# Patient Record
Sex: Male | Born: 1960 | Race: White | Hispanic: No | Marital: Married | Smoking: Current some day smoker
Health system: Southern US, Community
[De-identification: ages and names within clinical notes are randomized; demographics above are authoritative.]

## PROBLEM LIST (undated history)

## (undated) DIAGNOSIS — E079 Disorder of thyroid, unspecified: Secondary | ICD-10-CM

## (undated) DIAGNOSIS — E785 Hyperlipidemia, unspecified: Secondary | ICD-10-CM

## (undated) DIAGNOSIS — L299 Pruritus, unspecified: Secondary | ICD-10-CM

## (undated) DIAGNOSIS — M1990 Unspecified osteoarthritis, unspecified site: Secondary | ICD-10-CM

## (undated) DIAGNOSIS — T5691XA Toxic effect of unspecified metal, accidental (unintentional), initial encounter: Secondary | ICD-10-CM

---

## 2002-09-27 ENCOUNTER — Encounter: Admission: RE | Admit: 2002-09-27 | Discharge: 2002-09-27 | Payer: Self-pay | Admitting: Family Medicine

## 2002-09-27 ENCOUNTER — Encounter: Payer: Self-pay | Admitting: Family Medicine

## 2010-11-26 HISTORY — PX: JOINT REPLACEMENT: SHX530

## 2010-12-02 ENCOUNTER — Inpatient Hospital Stay (HOSPITAL_COMMUNITY)
Admission: RE | Admit: 2010-12-02 | Discharge: 2010-12-05 | Payer: Self-pay | Source: Home / Self Care | Attending: Orthopedic Surgery | Admitting: Orthopedic Surgery

## 2011-03-09 LAB — COMPREHENSIVE METABOLIC PANEL
Alkaline Phosphatase: 47 U/L (ref 39–117)
BUN: 11 mg/dL (ref 6–23)
Chloride: 105 mEq/L (ref 96–112)
Creatinine, Ser: 1.08 mg/dL (ref 0.4–1.5)
Glucose, Bld: 88 mg/dL (ref 70–99)
Potassium: 4.4 mEq/L (ref 3.5–5.1)
Total Bilirubin: 0.7 mg/dL (ref 0.3–1.2)

## 2011-03-09 LAB — CBC
HCT: 36.7 % — ABNORMAL LOW (ref 39.0–52.0)
HCT: 37.1 % — ABNORMAL LOW (ref 39.0–52.0)
HCT: 47 % (ref 39.0–52.0)
Hemoglobin: 11.7 g/dL — ABNORMAL LOW (ref 13.0–17.0)
Hemoglobin: 12.2 g/dL — ABNORMAL LOW (ref 13.0–17.0)
MCH: 30.5 pg (ref 26.0–34.0)
MCH: 30.9 pg (ref 26.0–34.0)
MCH: 31.4 pg (ref 26.0–34.0)
MCHC: 31.9 g/dL (ref 30.0–36.0)
MCHC: 32.4 g/dL (ref 30.0–36.0)
MCV: 92.8 fL (ref 78.0–100.0)
MCV: 95.5 fL (ref 78.0–100.0)
MCV: 95.6 fL (ref 78.0–100.0)
MCV: 92.3 fL (ref 78.0–100.0)
Platelets: 142 10*3/uL — ABNORMAL LOW (ref 150–400)
Platelets: 172 10*3/uL (ref 150–400)
RBC: 5.09 MIL/uL (ref 4.22–5.81)
RDW: 12.9 % (ref 11.5–15.5)
RDW: 12.9 % (ref 11.5–15.5)
RDW: 12.8 % (ref 11.5–15.5)
WBC: 8.3 10*3/uL (ref 4.0–10.5)
WBC: 7.6 10*3/uL (ref 4.0–10.5)

## 2011-03-09 LAB — URINALYSIS, ROUTINE W REFLEX MICROSCOPIC
Bilirubin Urine: NEGATIVE
Ketones, ur: NEGATIVE mg/dL
Nitrite: NEGATIVE
Protein, ur: NEGATIVE mg/dL
pH: 6.5 (ref 5.0–8.0)

## 2011-03-09 LAB — BASIC METABOLIC PANEL
BUN: 7 mg/dL (ref 6–23)
BUN: 9 mg/dL (ref 6–23)
CO2: 30 mEq/L (ref 19–32)
Chloride: 101 mEq/L (ref 96–112)
Chloride: 102 mEq/L (ref 96–112)
GFR calc non Af Amer: >60 mL/min (ref >60)
Glucose, Bld: 125 mg/dL — ABNORMAL HIGH (ref 70–99)
Potassium: 3.7 mEq/L (ref 3.5–5.1)
Potassium: 4.4 mEq/L (ref 3.5–5.1)
Sodium: 137 mEq/L (ref 135–145)
Sodium: 138 mEq/L (ref 135–145)

## 2011-03-09 LAB — APTT: aPTT: 44 seconds — ABNORMAL HIGH (ref 24–37)

## 2011-03-09 LAB — TYPE AND SCREEN
ABO/RH(D): A POS
Antibody Screen: NEGATIVE

## 2011-03-09 LAB — PROTIME-INR
INR: 1.02 (ref 0.00–1.49)
Prothrombin Time: 13.6 seconds (ref 11.6–15.2)

## 2011-03-09 LAB — ABO/RH: ABO/RH(D): A POS

## 2011-07-28 DEATH — deceased

## 2014-06-25 NOTE — Progress Notes (Signed)
Need orders in EPIC.  Surgery on 07/12/14.  Preop on 07/05/14 at 1030am.  Thank You.

## 2014-06-27 ENCOUNTER — Other Ambulatory Visit: Payer: Self-pay | Admitting: Orthopedic Surgery

## 2014-07-04 ENCOUNTER — Other Ambulatory Visit (HOSPITAL_COMMUNITY): Payer: Self-pay | Admitting: *Deleted

## 2014-07-04 NOTE — Patient Instructions (Addendum)
Brad Campbell  07/04/2014                           YOUR PROCEDURE IS SCHEDULED ON: 07/12/14 AT 12:30 PM               ENTER THRU [place] MAIN HOSPITAL ENTRANCE AND                            FOLLOW  SIGNS TO SHORT STAY CENTER                 ARRIVE AT SHORT STAY AT: 10:00 AM               CALL THIS NUMBER IF ANY PROBLEMS THE DAY OF SURGERY :               [phone]                                REMEMBER:   Do not eat food  AFTER MIDNIGHT   May have clear liquids UNTIL 6 HOURS BEFORE SURGERY (6:30 AM)               Take these medicines the morning of surgery with               A SIPS OF WATER :      NONE   Do not wear jewelry, make-up   Do not wear lotions, powders, or perfumes.   Do not shave legs or underarms 12 hrs. before surgery (men may shave face)  Do not bring valuables to the hospital.  Contacts, dentures or bridgework may not be worn into surgery.  Leave suitcase in the car. After surgery it may be brought to your room.  For patients admitted to the hospital more than one night, checkout time is            11:00 AM                                                      ________________________________________________________________________                               CLEAR LIQUID DIET   Foods Allowed                                                                     Foods Excluded  Coffee and tea, regular and decaf                             liquids that you cannot  Plain Jell-O in any flavor  see through such as: Fruit ices (not with fruit pulp)                                     milk, soups, orange juice  Iced Popsicles                                    All solid food Carbonated beverages, regular and diet                                    Cranberry, grape and apple juices Sports drinks like Gatorade Lightly seasoned clear broth or consume(fat free) Sugar, honey  syrup   _____________________________________________________________________                [place] - PREPARING FOR SURGERY  Before surgery, you can play an important role.  Because skin is not sterile, your skin needs to be as free of germs as possible.  You can reduce the number of germs on your skin by washing with CHG (chlorahexidine gluconate) soap before surgery.  CHG is an antiseptic cleaner which kills germs and bonds with the skin to continue killing germs even after washing. Please DO NOT use if you have an allergy to CHG or antibacterial soaps.  If your skin becomes reddened/irritated stop using the CHG and inform your nurse when you arrive at Short Stay. Do not shave (including legs and underarms) for at least 48 hours prior to the first CHG shower.  You may shave your face. Please follow these instructions carefully:   1.  Shower with CHG Soap the night before surgery and the  morning of Surgery.   2.  If you choose to wash your hair, wash your hair first as usual with your  normal  Shampoo.   3.  After you shampoo, rinse your hair and body thoroughly to remove the  shampoo.                                         4.  Use CHG as you would any other liquid soap.  You can apply chg directly  to the skin and wash . Gently wash with scrungie or clean wascloth    5.  Apply the CHG Soap to your body ONLY FROM THE NECK DOWN.   Do not use on open                           Wound or open sores. Avoid contact with eyes, ears mouth and genitals (private parts).                        Genitals (private parts) with your normal soap.              6.  Wash thoroughly, paying special attention to the area where your surgery  will be performed.   7.  Thoroughly rinse your body with warm water from the neck down.   8.  DO NOT shower/wash with your normal soap after using and rinsing off  the CHG Soap .  9.  Pat yourself dry with a clean towel.             10.  Wear clean  pajamas.             11.  Place clean sheets on your bed the night of your first shower and do not  sleep with pets.  Day of Surgery : Do not apply any lotions/deodorants the morning of surgery.  Please wear clean clothes to the hospital/surgery center.  FAILURE TO FOLLOW THESE INSTRUCTIONS MAY RESULT IN THE CANCELLATION OF YOUR SURGERY    PATIENT SIGNATURE_________________________________  ______________________________________________________________________     Brad Campbell  An incentive spirometer is a tool that can help keep your lungs clear and active. This tool measures how well you are filling your lungs with each breath. Taking long deep breaths may help reverse or decrease the chance of developing breathing (pulmonary) problems (especially infection) following:  A long period of time when you are unable to move or be active. BEFORE THE PROCEDURE   If the spirometer includes an indicator to show your best effort, your nurse or respiratory therapist will set it to a desired goal.  If possible, sit up straight or lean slightly forward. Try not to slouch.  Hold the incentive spirometer in an upright position. INSTRUCTIONS FOR USE  1. Sit on the edge of your bed if possible, or sit up as far as you can in bed or on a chair. 2. Hold the incentive spirometer in an upright position. 3. Breathe out normally. 4. Place the mouthpiece in your mouth and seal your lips tightly around it. 5. Breathe in slowly and as deeply as possible, raising the piston or the ball toward the top of the column. 6. Hold your breath for 3-5 seconds or for as long as possible. Allow the piston or ball to fall to the bottom of the column. 7. Remove the mouthpiece from your mouth and breathe out normally. 8. Rest for a few seconds and repeat Steps 1 through 7 at least 10 times every 1-2 hours when you are awake. Take your time and take a few normal breaths between deep breaths. 9. The spirometer  may include an indicator to show your best effort. Use the indicator as a goal to work toward during each repetition. 10. After each set of 10 deep breaths, practice coughing to be sure your lungs are clear. If you have an incision (the cut made at the time of surgery), support your incision when coughing by placing a pillow or rolled up towels firmly against it. Once you are able to get out of bed, walk around indoors and cough well. You may stop using the incentive spirometer when instructed by your caregiver.  RISKS AND COMPLICATIONS  Take your time so you do not get dizzy or light-headed.  If you are in pain, you may need to take or ask for pain medication before doing incentive spirometry. It is harder to take a deep breath if you are having pain. AFTER USE  Rest and breathe slowly and easily.  It can be helpful to keep track of a log of your progress. Your caregiver can provide you with a simple table to help with this. If you are using the spirometer at home, follow these instructions: [place] IF:   You are having difficultly using the spirometer.  You have trouble using the spirometer as often as instructed.  Your pain medication is not giving enough relief while  using the spirometer.  You develop fever of 100.5 F (38.1 C) or higher. SEEK IMMEDIATE MEDICAL CARE IF:   You cough up bloody sputum that had not been present before.  You develop fever of 102 F (38.9 C) or greater.  You develop worsening pain at or near the incision site. MAKE SURE YOU:   Understand these instructions.  Will watch your condition.  Will get help right away if you are not doing well or get worse. Document Released: 04/25/2007 Document Revised: 03/06/2012 Document Reviewed: 06/26/2007 Exit[employer] Patient Information 2014 Exit[employer], [place].   ________________________________________________________________________  WHAT IS A BLOOD TRANSFUSION? Blood Transfusion Information  A  transfusion is the replacement of blood or some of its parts. Blood is made up of multiple cells which provide different functions.  Red blood cells carry oxygen and are used for blood loss replacement.  White blood cells fight against infection.  Platelets control bleeding.  Plasma helps clot blood.  Other blood products are available for specialized needs, such as hemophilia or other clotting disorders. BEFORE THE TRANSFUSION  Who gives blood for transfusions?   Healthy volunteers who are fully evaluated to make sure their blood is safe. This is blood bank blood. Transfusion therapy is the safest it has ever been in the practice of medicine. Before blood is taken from a donor, a complete history is taken to make sure that person has no history of diseases nor engages in risky social behavior (examples are intravenous drug use or sexual activity with multiple partners). The donor's travel history is screened to minimize risk of transmitting infections, such as malaria. The donated blood is tested for signs of infectious diseases, such as HIV and hepatitis. The blood is then tested to be sure it is compatible with you in order to minimize the chance of a transfusion reaction. If you or a relative donates blood, this is often done in anticipation of surgery and is not appropriate for emergency situations. It takes many days to process the donated blood. RISKS AND COMPLICATIONS Although transfusion therapy is very safe and saves many lives, the main dangers of transfusion include:   Getting an infectious disease.  Developing a transfusion reaction. This is an allergic reaction to something in the blood you were given. Every precaution is taken to prevent this. The decision to have a blood transfusion has been considered carefully by your caregiver before blood is given. Blood is not given unless the benefits outweigh the risks. AFTER THE TRANSFUSION  Right after receiving a blood transfusion,  you will usually feel much better and more energetic. This is especially true if your red blood cells have gotten low (anemic). The transfusion raises the level of the red blood cells which carry oxygen, and this usually causes an energy increase.  The nurse administering the transfusion will monitor you carefully for complications. HOME CARE INSTRUCTIONS  No special instructions are needed after a transfusion. You may find your energy is better. Speak with your caregiver about any limitations on activity for underlying diseases you may have. SEEK MEDICAL CARE IF:   Your condition is not improving after your transfusion.  You develop redness or irritation at the intravenous (IV) site. SEEK IMMEDIATE MEDICAL CARE IF:  Any of the following symptoms occur over the next 12 hours:  Shaking chills.  You have a temperature by mouth above 102 F (38.9 C), not controlled by medicine.  Chest, back, or muscle pain.  People around you feel you are not acting correctly or  are confused.  Shortness of breath or difficulty breathing.  Dizziness and fainting.  You get a rash or develop hives.  You have a decrease in urine output.  Your urine turns a dark color or changes to pink, red, or brown. Any of the following symptoms occur over the next 10 days:  You have a temperature by mouth above 102 F (38.9 C), not controlled by medicine.  Shortness of breath.  Weakness after normal activity.  The white part of the eye turns yellow (jaundice).  You have a decrease in the amount of urine or are urinating less often.  Your urine turns a dark color or changes to pink, red, or brown. Document Released: 12/10/2000 Document Revised: 03/06/2012 Document Reviewed: 07/29/2008 [facility] Patient Information 2014 [place], [place].  _______________________________________________________________________

## 2014-07-05 ENCOUNTER — Encounter (HOSPITAL_COMMUNITY): Payer: Self-pay

## 2014-07-05 ENCOUNTER — Encounter (INDEPENDENT_AMBULATORY_CARE_PROVIDER_SITE_OTHER): Payer: Self-pay

## 2014-07-05 ENCOUNTER — Ambulatory Visit (HOSPITAL_COMMUNITY)
Admission: RE | Admit: 2014-07-05 | Discharge: 2014-07-05 | Disposition: A | Discharge status: Home / Self Care | Payer: 59 | Source: Ambulatory Visit | Attending: Orthopedic Surgery | Admitting: Orthopedic Surgery

## 2014-07-05 ENCOUNTER — Encounter (HOSPITAL_COMMUNITY): Payer: Self-pay | Admitting: Pharmacy Technician

## 2014-07-05 ENCOUNTER — Encounter (HOSPITAL_COMMUNITY)
Admission: RE | Admit: 2014-07-05 | Discharge: 2014-07-05 | Disposition: A | Discharge status: Home / Self Care | Payer: 59 | Source: Ambulatory Visit | Attending: Orthopedic Surgery | Admitting: Orthopedic Surgery

## 2014-07-05 DIAGNOSIS — Z01818 Encounter for other preprocedural examination: Secondary | ICD-10-CM | POA: Insufficient documentation

## 2014-07-05 DIAGNOSIS — M1610 Unilateral primary osteoarthritis, unspecified hip: Secondary | ICD-10-CM | POA: Insufficient documentation

## 2014-07-05 DIAGNOSIS — Z01812 Encounter for preprocedural laboratory examination: Secondary | ICD-10-CM | POA: Insufficient documentation

## 2014-07-05 DIAGNOSIS — M169 Osteoarthritis of hip, unspecified: Secondary | ICD-10-CM | POA: Insufficient documentation

## 2014-07-05 HISTORY — DX: Pruritus, unspecified: L29.9

## 2014-07-05 HISTORY — DX: Hyperlipidemia, unspecified: E78.5

## 2014-07-05 HISTORY — DX: Toxic effect of unspecified metal, accidental (unintentional), initial encounter: T56.91XA

## 2014-07-05 HISTORY — DX: Unspecified osteoarthritis, unspecified site: M19.90

## 2014-07-05 HISTORY — DX: Disorder of thyroid, unspecified: E07.9

## 2014-07-05 LAB — COMPREHENSIVE METABOLIC PANEL
ALBUMIN: 3.7 g/dL (ref 3.5–5.2)
ALT: 19 U/L (ref 0–53)
AST: 20 U/L (ref 0–37)
Alkaline Phosphatase: 98 U/L (ref 39–117)
Anion gap: 11 (ref 5–15)
BUN: 14 mg/dL (ref 6–23)
CALCIUM: 9.5 mg/dL (ref 8.4–10.5)
CO2: 28 meq/L (ref 19–32)
Chloride: 101 mEq/L (ref 96–112)
Creatinine, Ser: 0.91 mg/dL (ref 0.50–1.35)
GFR calc Af Amer: >90 mL/min (ref >90)
GFR calc non Af Amer: >90 mL/min (ref >90)
Glucose, Bld: 80 mg/dL (ref 70–99)
Potassium: 4.4 mEq/L (ref 3.7–5.3)
SODIUM: 140 meq/L (ref 137–147)
TOTAL PROTEIN: 8 g/dL (ref 6.0–8.3)
Total Bilirubin: 0.2 mg/dL — ABNORMAL LOW (ref 0.3–1.2)

## 2014-07-05 LAB — CBC
HCT: 42.6 % (ref 39.0–52.0)
Hemoglobin: 13.5 g/dL (ref 13.0–17.0)
MCH: 27.4 pg (ref 26.0–34.0)
MCHC: 31.7 g/dL (ref 30.0–36.0)
MCV: 86.4 fL (ref 78.0–100.0)
Platelets: 248 10*3/uL (ref 150–400)
RBC: 4.93 MIL/uL (ref 4.22–5.81)
RDW: 13.9 % (ref 11.5–15.5)
WBC: 10.9 10*3/uL — ABNORMAL HIGH (ref 4.0–10.5)

## 2014-07-05 LAB — SURGICAL PCR SCREEN
MRSA, PCR: NEGATIVE
Staphylococcus aureus: POSITIVE — AB

## 2014-07-05 LAB — URINALYSIS, ROUTINE W REFLEX MICROSCOPIC
Bilirubin Urine: NEGATIVE
Glucose, UA: NEGATIVE mg/dL
Hgb urine dipstick: NEGATIVE
Ketones, ur: NEGATIVE mg/dL
LEUKOCYTES UA: NEGATIVE
NITRITE: NEGATIVE
PH: 6 (ref 5.0–8.0)
Protein, ur: NEGATIVE mg/dL
SPECIFIC GRAVITY, URINE: 1.016 (ref 1.005–1.030)
UROBILINOGEN UA: 1 mg/dL (ref 0.0–1.0)

## 2014-07-05 LAB — APTT: aPTT: 41 seconds — ABNORMAL HIGH (ref 24–37)

## 2014-07-05 LAB — PROTIME-INR
INR: 1.01 (ref 0.00–1.49)
Prothrombin Time: 13.3 seconds (ref 11.6–15.2)

## 2014-07-05 NOTE — Progress Notes (Signed)
07/05/14 1036  OBSTRUCTIVE SLEEP APNEA  Have you ever been diagnosed with sleep apnea through a sleep study? No  Do you snore loudly (loud enough to be heard through closed doors)?  1  Do you often feel tired, fatigued, or sleepy during the daytime? 1  Has anyone observed you stop breathing during your sleep? 0  Do you have, or are you being treated for high blood pressure? 0  BMI more than 35 kg/m2? 0  Age over 51 years old? 1  Neck circumference greater than 40 cm/16 inches? 1  Gender: 1  Obstructive Sleep Apnea Score 5  Score 4 or greater  Results sent to PCP

## 2014-07-05 NOTE — Progress Notes (Signed)
Rx Mupuricin called to [facility] - pt notified - PCR routed to Dr. Lequita HaltAluisio

## 2014-07-12 ENCOUNTER — Inpatient Hospital Stay (HOSPITAL_COMMUNITY): Payer: 59 | Admitting: Anesthesiology

## 2014-07-12 ENCOUNTER — Inpatient Hospital Stay (HOSPITAL_COMMUNITY)
Admission: RE | Admit: 2014-07-12 | Discharge: 2014-07-13 | DRG: 468 | Disposition: A | Discharge status: Home Health | Payer: 59 | Source: Ambulatory Visit | Attending: Orthopedic Surgery | Admitting: Orthopedic Surgery

## 2014-07-12 ENCOUNTER — Encounter (HOSPITAL_COMMUNITY)
Admission: RE | Disposition: A | Discharge status: Home Health | Payer: Self-pay | Source: Ambulatory Visit | Attending: Orthopedic Surgery

## 2014-07-12 ENCOUNTER — Encounter (HOSPITAL_COMMUNITY): Payer: 59 | Admitting: Anesthesiology

## 2014-07-12 ENCOUNTER — Encounter (HOSPITAL_COMMUNITY): Payer: Self-pay | Admitting: *Deleted

## 2014-07-12 ENCOUNTER — Inpatient Hospital Stay (HOSPITAL_COMMUNITY): Payer: 59

## 2014-07-12 ENCOUNTER — Other Ambulatory Visit: Payer: Self-pay | Admitting: Orthopedic Surgery

## 2014-07-12 DIAGNOSIS — E785 Hyperlipidemia, unspecified: Secondary | ICD-10-CM | POA: Diagnosis present

## 2014-07-12 DIAGNOSIS — T84018A Broken internal joint prosthesis, other site, initial encounter: Secondary | ICD-10-CM

## 2014-07-12 DIAGNOSIS — Z8249 Family history of ischemic heart disease and other diseases of the circulatory system: Secondary | ICD-10-CM

## 2014-07-12 DIAGNOSIS — R7989 Other specified abnormal findings of blood chemistry: Secondary | ICD-10-CM | POA: Diagnosis present

## 2014-07-12 DIAGNOSIS — Z96649 Presence of unspecified artificial hip joint: Secondary | ICD-10-CM

## 2014-07-12 DIAGNOSIS — M1610 Unilateral primary osteoarthritis, unspecified hip: Secondary | ICD-10-CM | POA: Diagnosis present

## 2014-07-12 DIAGNOSIS — Z79899 Other long term (current) drug therapy: Secondary | ICD-10-CM

## 2014-07-12 DIAGNOSIS — Z87891 Personal history of nicotine dependence: Secondary | ICD-10-CM

## 2014-07-12 DIAGNOSIS — Y831 Surgical operation with implant of artificial internal device as the cause of abnormal reaction of the patient, or of later complication, without mention of misadventure at the time of the procedure: Secondary | ICD-10-CM | POA: Diagnosis present

## 2014-07-12 DIAGNOSIS — M169 Osteoarthritis of hip, unspecified: Secondary | ICD-10-CM

## 2014-07-12 DIAGNOSIS — T84099A Other mechanical complication of unspecified internal joint prosthesis, initial encounter: Principal | ICD-10-CM | POA: Diagnosis present

## 2014-07-12 HISTORY — PX: TOTAL HIP REVISION: SHX763

## 2014-07-12 LAB — TYPE AND SCREEN
ABO/RH(D): A POS
ANTIBODY SCREEN: POSITIVE
DAT, IgG: NEGATIVE

## 2014-07-12 LAB — GRAM STAIN: Gram Stain: NONE SEEN

## 2014-07-12 SURGERY — TOTAL HIP REVISION
Anesthesia: Choice | Site: Hip | Laterality: Left

## 2014-07-12 MED ORDER — HYDROMORPHONE HCL PF 2 MG/ML IJ SOLN
INTRAMUSCULAR | Status: AC
  Filled 2014-07-12: qty 1

## 2014-07-12 MED ORDER — GLYCOPYRROLATE 0.2 MG/ML IJ SOLN
INTRAMUSCULAR | Status: DC | PRN
  Administered 2014-07-12: 0.6 mg via INTRAVENOUS

## 2014-07-12 MED ORDER — FLEET ENEMA 7-19 GM/118ML RE ENEM: 1.0000 | ENEMA | Freq: Once | RECTAL | Status: AC | PRN

## 2014-07-12 MED ORDER — STERILE WATER FOR IRRIGATION IR SOLN
Status: DC | PRN
  Administered 2014-07-12: 1500 mL

## 2014-07-12 MED ORDER — KETOROLAC TROMETHAMINE 15 MG/ML IJ SOLN
INTRAMUSCULAR | Status: AC
  Filled 2014-07-12: qty 1

## 2014-07-12 MED ORDER — OXYCODONE HCL 5 MG PO TABS
5.0000 mg | ORAL_TABLET | ORAL | Status: DC | PRN
  Administered 2014-07-12 – 2014-07-13 (×4): 10 mg via ORAL
  Administered 2014-07-13: 5 mg via ORAL
  Filled 2014-07-12 (×4): qty 2
  Filled 2014-07-12: qty 1

## 2014-07-12 MED ORDER — ACETAMINOPHEN 325 MG PO TABS: 650.0000 mg | ORAL_TABLET | Freq: Four times a day (QID) | ORAL | Status: DC | PRN

## 2014-07-12 MED ORDER — DOCUSATE SODIUM 100 MG PO CAPS
100.0000 mg | ORAL_CAPSULE | Freq: Two times a day (BID) | ORAL | Status: DC
  Administered 2014-07-12 – 2014-07-13 (×2): 100 mg via ORAL

## 2014-07-12 MED ORDER — CHLORHEXIDINE GLUCONATE 4 % EX LIQD: 60.0000 mL | Freq: Once | CUTANEOUS | Status: DC

## 2014-07-12 MED ORDER — KETOROLAC TROMETHAMINE 15 MG/ML IJ SOLN
7.5000 mg | Freq: Four times a day (QID) | INTRAMUSCULAR | Status: AC | PRN
  Administered 2014-07-12: 7.5 mg via INTRAVENOUS

## 2014-07-12 MED ORDER — PROMETHAZINE HCL 25 MG/ML IJ SOLN: 6.2500 mg | INTRAMUSCULAR | Status: DC | PRN

## 2014-07-12 MED ORDER — MIDAZOLAM HCL 2 MG/2ML IJ SOLN
INTRAMUSCULAR | Status: AC
  Filled 2014-07-12: qty 2

## 2014-07-12 MED ORDER — BISACODYL 10 MG RE SUPP: 10.0000 mg | Freq: Every day | RECTAL | Status: DC | PRN

## 2014-07-12 MED ORDER — POLYETHYLENE GLYCOL 3350 17 G PO PACK: 17.0000 g | PACK | Freq: Every day | ORAL | Status: DC | PRN

## 2014-07-12 MED ORDER — MENTHOL 3 MG MT LOZG
1.0000 | LOZENGE | OROMUCOSAL | Status: DC | PRN
  Filled 2014-07-12: qty 9

## 2014-07-12 MED ORDER — DEXAMETHASONE 4 MG PO TABS
10.0000 mg | ORAL_TABLET | Freq: Every day | ORAL | Status: AC
  Administered 2014-07-13: 10 mg via ORAL
  Filled 2014-07-12: qty 1

## 2014-07-12 MED ORDER — BUPIVACAINE HCL (PF) 0.25 % IJ SOLN
INTRAMUSCULAR | Status: AC
  Filled 2014-07-12: qty 30

## 2014-07-12 MED ORDER — FENTANYL CITRATE 0.05 MG/ML IJ SOLN
INTRAMUSCULAR | Status: AC
  Filled 2014-07-12: qty 5

## 2014-07-12 MED ORDER — OXYCODONE HCL 5 MG/5ML PO SOLN
5.0000 mg | Freq: Once | ORAL | Status: DC | PRN
  Filled 2014-07-12: qty 5

## 2014-07-12 MED ORDER — LIDOCAINE HCL (CARDIAC) 20 MG/ML IV SOLN
INTRAVENOUS | Status: DC | PRN
  Administered 2014-07-12: 100 mg via INTRAVENOUS

## 2014-07-12 MED ORDER — FENTANYL CITRATE 0.05 MG/ML IJ SOLN
INTRAMUSCULAR | Status: DC | PRN
  Administered 2014-07-12: 100 ug via INTRAVENOUS
  Administered 2014-07-12: 50 ug via INTRAVENOUS
  Administered 2014-07-12: 100 ug via INTRAVENOUS

## 2014-07-12 MED ORDER — BUPIVACAINE LIPOSOME 1.3 % IJ SUSP
20.0000 mL | Freq: Once | INTRAMUSCULAR | Status: DC
  Filled 2014-07-12: qty 20

## 2014-07-12 MED ORDER — HYDROMORPHONE HCL PF 1 MG/ML IJ SOLN
0.2500 mg | INTRAMUSCULAR | Status: DC | PRN
  Administered 2014-07-12 (×3): 0.5 mg via INTRAVENOUS

## 2014-07-12 MED ORDER — LEVOTHYROXINE SODIUM 75 MCG PO TABS
75.0000 ug | ORAL_TABLET | Freq: Every day | ORAL | Status: DC
  Administered 2014-07-12: 75 ug via ORAL
  Filled 2014-07-12 (×2): qty 1

## 2014-07-12 MED ORDER — ACETAMINOPHEN 500 MG PO TABS
1000.0000 mg | ORAL_TABLET | Freq: Four times a day (QID) | ORAL | Status: AC
  Administered 2014-07-12 – 2014-07-13 (×4): 1000 mg via ORAL
  Filled 2014-07-12 (×3): qty 2

## 2014-07-12 MED ORDER — CEFAZOLIN SODIUM-DEXTROSE 2-3 GM-% IV SOLR
INTRAVENOUS | Status: AC
  Filled 2014-07-12: qty 50

## 2014-07-12 MED ORDER — ONDANSETRON HCL 4 MG/2ML IJ SOLN
INTRAMUSCULAR | Status: DC | PRN
  Administered 2014-07-12: 4 mg via INTRAVENOUS

## 2014-07-12 MED ORDER — DEXAMETHASONE SODIUM PHOSPHATE 10 MG/ML IJ SOLN
INTRAMUSCULAR | Status: AC
  Filled 2014-07-12: qty 1

## 2014-07-12 MED ORDER — ONDANSETRON HCL 4 MG/2ML IJ SOLN
INTRAMUSCULAR | Status: AC
  Filled 2014-07-12: qty 2

## 2014-07-12 MED ORDER — ACETAMINOPHEN 650 MG RE SUPP: 650.0000 mg | Freq: Four times a day (QID) | RECTAL | Status: DC | PRN

## 2014-07-12 MED ORDER — METHOCARBAMOL 500 MG PO TABS
500.0000 mg | ORAL_TABLET | Freq: Four times a day (QID) | ORAL | Status: DC | PRN
  Administered 2014-07-12 – 2014-07-13 (×2): 500 mg via ORAL
  Filled 2014-07-12 (×2): qty 1

## 2014-07-12 MED ORDER — SODIUM CHLORIDE 0.9 % IJ SOLN
INTRAMUSCULAR | Status: DC | PRN
  Administered 2014-07-12: 30 mL via INTRAVENOUS

## 2014-07-12 MED ORDER — TRANEXAMIC ACID 100 MG/ML IV SOLN
1000.0000 mg | INTRAVENOUS | Status: AC
  Administered 2014-07-12: 1000 mg via INTRAVENOUS
  Filled 2014-07-12: qty 10

## 2014-07-12 MED ORDER — NEOSTIGMINE METHYLSULFATE 10 MG/10ML IV SOLN
INTRAVENOUS | Status: DC | PRN
  Administered 2014-07-12: 4 mg via INTRAVENOUS

## 2014-07-12 MED ORDER — DIPHENHYDRAMINE HCL 12.5 MG/5ML PO ELIX: 12.5000 mg | ORAL_SOLUTION | ORAL | Status: DC | PRN

## 2014-07-12 MED ORDER — BUPIVACAINE HCL 0.25 % IJ SOLN
INTRAMUSCULAR | Status: DC | PRN
  Administered 2014-07-12: 20 mL

## 2014-07-12 MED ORDER — HYDROMORPHONE HCL PF 1 MG/ML IJ SOLN
INTRAMUSCULAR | Status: AC
  Filled 2014-07-12: qty 1

## 2014-07-12 MED ORDER — OXYCODONE HCL 5 MG PO TABS: 5.0000 mg | ORAL_TABLET | Freq: Once | ORAL | Status: DC | PRN

## 2014-07-12 MED ORDER — HYDROMORPHONE HCL PF 1 MG/ML IJ SOLN
INTRAMUSCULAR | Status: DC | PRN
  Administered 2014-07-12 (×2): 1 mg via INTRAVENOUS

## 2014-07-12 MED ORDER — ROCURONIUM BROMIDE 100 MG/10ML IV SOLN
INTRAVENOUS | Status: AC
  Filled 2014-07-12: qty 1

## 2014-07-12 MED ORDER — SIMVASTATIN 20 MG PO TABS
20.0000 mg | ORAL_TABLET | Freq: Every day | ORAL | Status: DC
  Administered 2014-07-12: 20 mg via ORAL
  Filled 2014-07-12 (×2): qty 1

## 2014-07-12 MED ORDER — LACTATED RINGERS IV SOLN
INTRAVENOUS | Status: DC
  Administered 2014-07-12: 14:00:00 via INTRAVENOUS
  Administered 2014-07-12: 1000 mL via INTRAVENOUS

## 2014-07-12 MED ORDER — METHOCARBAMOL 1000 MG/10ML IJ SOLN
500.0000 mg | Freq: Four times a day (QID) | INTRAVENOUS | Status: DC | PRN
  Administered 2014-07-12: 500 mg via INTRAVENOUS
  Filled 2014-07-12: qty 5

## 2014-07-12 MED ORDER — MORPHINE SULFATE 2 MG/ML IJ SOLN: 1.0000 mg | INTRAMUSCULAR | Status: DC | PRN

## 2014-07-12 MED ORDER — MIDAZOLAM HCL 5 MG/5ML IJ SOLN
INTRAMUSCULAR | Status: DC | PRN
  Administered 2014-07-12: 2 mg via INTRAVENOUS

## 2014-07-12 MED ORDER — ROCURONIUM BROMIDE 100 MG/10ML IV SOLN
INTRAVENOUS | Status: DC | PRN
  Administered 2014-07-12: 60 mg via INTRAVENOUS
  Administered 2014-07-12: 10 mg via INTRAVENOUS

## 2014-07-12 MED ORDER — 0.9 % SODIUM CHLORIDE (POUR BTL) OPTIME
TOPICAL | Status: DC | PRN
  Administered 2014-07-12: 1000 mL

## 2014-07-12 MED ORDER — BUPIVACAINE LIPOSOME 1.3 % IJ SUSP
20.0000 mL | Freq: Once | INTRAMUSCULAR | Status: AC
  Administered 2014-07-12: 20 mL
  Filled 2014-07-12: qty 20

## 2014-07-12 MED ORDER — METOCLOPRAMIDE HCL 10 MG PO TABS: 5.0000 mg | ORAL_TABLET | Freq: Three times a day (TID) | ORAL | Status: DC | PRN

## 2014-07-12 MED ORDER — MEPERIDINE HCL 50 MG/ML IJ SOLN
6.2500 mg | INTRAMUSCULAR | Status: DC | PRN
Start: 2014-07-12 — End: 2014-07-12

## 2014-07-12 MED ORDER — DEXTROSE-NACL 5-0.9 % IV SOLN
INTRAVENOUS | Status: DC
  Administered 2014-07-12: 17:00:00 via INTRAVENOUS

## 2014-07-12 MED ORDER — ACETAMINOPHEN 10 MG/ML IV SOLN
1000.0000 mg | Freq: Once | INTRAVENOUS | Status: AC
  Administered 2014-07-12: 1000 mg via INTRAVENOUS
  Filled 2014-07-12: qty 100

## 2014-07-12 MED ORDER — CEFAZOLIN SODIUM-DEXTROSE 2-3 GM-% IV SOLR
2.0000 g | INTRAVENOUS | Status: AC
  Administered 2014-07-12: 2 g via INTRAVENOUS

## 2014-07-12 MED ORDER — ONDANSETRON HCL 4 MG/2ML IJ SOLN: 4.0000 mg | Freq: Four times a day (QID) | INTRAMUSCULAR | Status: DC | PRN

## 2014-07-12 MED ORDER — DEXAMETHASONE SODIUM PHOSPHATE 10 MG/ML IJ SOLN
10.0000 mg | Freq: Once | INTRAMUSCULAR | Status: AC
  Administered 2014-07-12: 10 mg via INTRAVENOUS

## 2014-07-12 MED ORDER — DEXAMETHASONE SODIUM PHOSPHATE 10 MG/ML IJ SOLN
10.0000 mg | Freq: Every day | INTRAMUSCULAR | Status: AC
  Filled 2014-07-12: qty 1

## 2014-07-12 MED ORDER — LIDOCAINE HCL (CARDIAC) 20 MG/ML IV SOLN
INTRAVENOUS | Status: AC
  Filled 2014-07-12: qty 5

## 2014-07-12 MED ORDER — METOCLOPRAMIDE HCL 5 MG/ML IJ SOLN: 5.0000 mg | Freq: Three times a day (TID) | INTRAMUSCULAR | Status: DC | PRN

## 2014-07-12 MED ORDER — TRAMADOL HCL 50 MG PO TABS: 50.0000 mg | ORAL_TABLET | Freq: Four times a day (QID) | ORAL | Status: DC | PRN

## 2014-07-12 MED ORDER — PROPOFOL 10 MG/ML IV BOLUS
INTRAVENOUS | Status: AC
  Filled 2014-07-12: qty 20

## 2014-07-12 MED ORDER — PROPOFOL 10 MG/ML IV BOLUS
INTRAVENOUS | Status: DC | PRN
  Administered 2014-07-12: 200 mg via INTRAVENOUS

## 2014-07-12 MED ORDER — CEFAZOLIN SODIUM-DEXTROSE 2-3 GM-% IV SOLR
2.0000 g | Freq: Four times a day (QID) | INTRAVENOUS | Status: AC
  Administered 2014-07-12 – 2014-07-13 (×2): 2 g via INTRAVENOUS
  Filled 2014-07-12 (×2): qty 50

## 2014-07-12 MED ORDER — RIVAROXABAN 10 MG PO TABS
10.0000 mg | ORAL_TABLET | Freq: Every day | ORAL | Status: DC
  Administered 2014-07-13: 10 mg via ORAL
  Filled 2014-07-12 (×2): qty 1

## 2014-07-12 MED ORDER — SODIUM CHLORIDE 0.9 % IV SOLN: INTRAVENOUS | Status: DC

## 2014-07-12 MED ORDER — PHENOL 1.4 % MT LIQD: 1.0000 | OROMUCOSAL | Status: DC | PRN

## 2014-07-12 MED ORDER — ONDANSETRON HCL 4 MG PO TABS: 4.0000 mg | ORAL_TABLET | Freq: Four times a day (QID) | ORAL | Status: DC | PRN

## 2014-07-12 SURGICAL SUPPLY — 59 items
BAG SPEC THK2 15X12 ZIP CLS (MISCELLANEOUS)
BAG ZIPLOCK 12X15 (MISCELLANEOUS) ×3 IMPLANT
BIT DRILL 2.8X128 (BIT) ×2 IMPLANT
BLADE EXTENDED COATED 6.5IN (ELECTRODE) ×2 IMPLANT
BLADE SAW SAG 73X25 THK (BLADE) ×1
BLADE SAW SGTL 73X25 THK (BLADE) ×1 IMPLANT
DRAPE INCISE IOBAN 66X45 STRL (DRAPES) ×2 IMPLANT
DRAPE ORTHO SPLIT 77X108 STRL (DRAPES) ×4
DRAPE POUCH INSTRU U-SHP 10X18 (DRAPES) ×2 IMPLANT
DRAPE SURG ORHT 6 SPLT 77X108 (DRAPES) ×2 IMPLANT
DRAPE U-SHAPE 47X51 STRL (DRAPES) ×2 IMPLANT
DRSG EMULSION OIL 3X16 NADH (GAUZE/BANDAGES/DRESSINGS) ×2 IMPLANT
DRSG MEPILEX BORDER 4X4 (GAUZE/BANDAGES/DRESSINGS) ×3 IMPLANT
DRSG MEPILEX BORDER 4X8 (GAUZE/BANDAGES/DRESSINGS) ×2 IMPLANT
DURAPREP 26ML APPLICATOR (WOUND CARE) ×2 IMPLANT
ELECT REM PT RETURN 9FT ADLT (ELECTROSURGICAL) ×2
ELECTRODE REM PT RTRN 9FT ADLT (ELECTROSURGICAL) ×1 IMPLANT
EVACUATOR 1/8 PVC DRAIN (DRAIN) ×1 IMPLANT
FACESHIELD WRAPAROUND (MASK) ×8 IMPLANT
FACESHIELD WRAPAROUND OR TEAM (MASK) ×4 IMPLANT
GAUZE SPONGE 4X4 12PLY STRL (GAUZE/BANDAGES/DRESSINGS) ×2 IMPLANT
GLOVE BIO SURGEON STRL SZ7.5 (GLOVE) ×2 IMPLANT
GLOVE BIO SURGEON STRL SZ8 (GLOVE) ×2 IMPLANT
GLOVE BIOGEL PI IND STRL 8 (GLOVE) ×3 IMPLANT
GLOVE BIOGEL PI INDICATOR 8 (GLOVE) ×3
GLOVE SURG SS PI 6.5 STRL IVOR (GLOVE) ×6 IMPLANT
GOWN STRL REUS W/TWL LRG LVL3 (GOWN DISPOSABLE) ×5 IMPLANT
GOWN STRL REUS W/TWL XL LVL3 (GOWN DISPOSABLE) ×2 IMPLANT
HEAD CERAMIC BIOLOX DELTA 36 6 (Hips) ×1 IMPLANT
IMMOBILIZER KNEE 20 (SOFTGOODS)
IMMOBILIZER KNEE 20 THIGH 36 (SOFTGOODS) IMPLANT
KIT BASIN OR (CUSTOM PROCEDURE TRAY) ×2 IMPLANT
LINER MARATHON NEUT +4X58X36 (Hips) ×1 IMPLANT
MANIFOLD NEPTUNE II (INSTRUMENTS) ×2 IMPLANT
NDL SAFETY ECLIPSE 18X1.5 (NEEDLE) IMPLANT
NEEDLE HYPO 18GX1.5 SHARP (NEEDLE) ×2
NS IRRIG 1000ML POUR BTL (IV SOLUTION) ×2 IMPLANT
PACK TOTAL JOINT (CUSTOM PROCEDURE TRAY) ×2 IMPLANT
PADDING CAST COTTON 6X4 STRL (CAST SUPPLIES) ×2 IMPLANT
PASSER SUT SWANSON 36MM LOOP (INSTRUMENTS) ×2 IMPLANT
POSITIONER SURGICAL ARM (MISCELLANEOUS) ×2 IMPLANT
SPONGE LAP 18X18 X RAY DECT (DISPOSABLE) ×2 IMPLANT
SROM FEM STEM STD 20X15 36+12 (Hips) ×2 IMPLANT
STAPLER VISISTAT 35W (STAPLE) ×2 IMPLANT
STEM FEM SROM STD 20X15 36+12 (Hips) IMPLANT
STRIP CLOSURE SKIN 1/2X4 (GAUZE/BANDAGES/DRESSINGS) ×1 IMPLANT
SUCTION FRAZIER TIP 10 FR DISP (SUCTIONS) ×2 IMPLANT
SUT ETHIBOND NAB CT1 #1 30IN (SUTURE) ×4 IMPLANT
SUT VIC AB 1 CT1 27 (SUTURE) ×2
SUT VIC AB 1 CT1 27XBRD ANTBC (SUTURE) ×3 IMPLANT
SUT VIC AB 2-0 CT1 27 (SUTURE) ×2
SUT VIC AB 2-0 CT1 TAPERPNT 27 (SUTURE) ×3 IMPLANT
SUT VLOC 180 0 24IN GS25 (SUTURE) ×4 IMPLANT
SWAB COLLECTION DEVICE MRSA (MISCELLANEOUS) ×2 IMPLANT
SYRINGE 60CC LL (MISCELLANEOUS) ×1 IMPLANT
TOWEL OR 17X26 10 PK STRL BLUE (TOWEL DISPOSABLE) ×4 IMPLANT
TRAY FOLEY CATH 14FRSI W/METER (CATHETERS) ×2 IMPLANT
TUBE ANAEROBIC SPECIMEN COL (MISCELLANEOUS) ×1 IMPLANT
WATER STERILE IRR 1500ML POUR (IV SOLUTION) ×2 IMPLANT

## 2014-07-12 NOTE — H&P (View-Only) (Signed)
Brad Campbell DOB: 31-Aug-1961 Married / Language: English / Race: White Male Date of Admission:  07/12/2014 Chief complaint:  Left Hip Pain History of Present Illness  The patient is a 53 year old male who comes in for a preoperative History and Physical. The patient is scheduled for a left total hip arthroplasty (revision versus left hip bearing surface exchange) to be performed by Dr. Gus RankinFrank V. Aluisio, MD at [facility] on 07-12-2014. The patient is a 53 year old male who presents for follow up of their hip. The patient is being followed for their left incisional swelling. Symptoms reported today include: pain and swelling. The patient feels that they are doing poorly and report their pain level to be 7 / 10. The following medication has been used for pain control: antiinflammatory medication. He descibes the pain as lt. buttock pain that goes to the thigh. He tried to play golf the other day and after 9 holes had more pain that went from the buttock to the thigh. He gets fatigued. Denies numbness. Brad FearingJames comes in for recheck of his left hip pain. 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 He is continuing to have quite a bit of discomfort. He did recently go play golf. He did not have any specific injury, but he does report that with increased activity he has increased discomfort. He is even feeling that his altered gait is causing some low back discomfort. He denies fevers and chills. His wife feels that he is having increased swelling over that area and feels that the redness that he had over the incision area has become more of a problem. They have been treated conservatively in the past for the above stated problem and despite conservative measures, they continue to have progressive pain and severe functional limitations and dysfunction.  They have failed non-operative management including home exercise, medications in the form of NSAIDs. It is felt that they would benefit from undergoing revision of the total joint replacement. Risks and benefits of the procedure have been discussed with the patient and they elect to proceed with surgery. There are no active contraindications to surgery such as ongoing infection or rapidly progressive neurological disease.  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 Depression Mother, Sister. Hypertension Mother.  Social History Tobacco use Former smoker. 03/08/2014 Current work status working full time No history of drug/alcohol rehab Living situation live with spouse Marital status married Not under pain contract Number of flights of stairs before winded 2-3 Children 3 Current drinker 03/08/2014: Currently drinks beer only occasionally per week Exercise Exercises monthly; does running / walking Post-Surgical Plans Home  Medication History  Simvastatin (20MG  Tablet, Oral) Active. Levothyroxine Sodium (75MCG Tablet, Oral) Active.  Past Surgical History  Total Hip Replacement left  Review of Systems  General Not Present- Chills, Fatigue, Fever, Memory Loss, Night Sweats, Weight Gain and Weight Loss. Skin Not Present- Eczema, Hives, Itching, Lesions and Rash. HEENT Not Present- Dentures, Double Vision, Headache, Hearing Loss, Tinnitus and Visual Loss. Respiratory Not Present- Allergies, Chronic Cough, Coughing up blood, Shortness of breath at rest and Shortness of breath with exertion. Cardiovascular Not Present- Chest  Pain, Difficulty Breathing Lying Down, Murmur, Palpitations,  Racing/skipping heartbeats and Swelling. Gastrointestinal Not Present- Abdominal Pain, Bloody Stool, Constipation, Diarrhea, Difficulty Swallowing, Heartburn, Jaundice, Loss of appetitie, Nausea and Vomiting. Male Genitourinary Not Present- Blood in Urine, Discharge, Flank Pain, Incontinence, Painful Urination, Urgency, Urinary frequency, Urinary Retention, Urinating at Night and Weak urinary stream. Musculoskeletal Present- Joint Pain and Morning Stiffness. Not Present- Back Pain, Joint Swelling, Muscle Pain, Muscle Weakness and Spasms. Neurological Not Present- Blackout spells, Difficulty with balance, Dizziness, Paralysis, Tremor and Weakness. Psychiatric Not Present- Insomnia.   Vitals Weight: 255 lb Height: 76in Weight was reported by patient. Height was reported by patient. Body Surface Area: 2.49 m Body Mass Index: 31.04 kg/m Pulse: 72 (Regular)  BP: 124/88 (Sitting, Left Arm, Standard)    Physical Exam  General Mental Status -Alert, cooperative and good historian. General Appearance-pleasant, Not in acute distress. Orientation-Oriented X3. Build & Nutrition-Well nourished and Well developed(tall framed).  Head and Neck Head-normocephalic, atraumatic . Neck Global Assessment - supple, no bruit auscultated on the right, no bruit auscultated on the left.  Eye Vision-Wears contact lenses. Pupil - Bilateral-PERR Motion - Bilateral-EOMI.  Chest and Lung Exam Auscultation Breath sounds - clear at anterior chest wall and clear at posterior chest wall. Adventitious sounds - No Adventitious sounds.  Cardiovascular Auscultation Rhythm - Regular rate and rhythm. Heart Sounds - S1 WNL and S2 WNL. Murmurs & Other Heart Sounds - Auscultation of the heart reveals - No Murmurs.  Abdomen Palpation/Percussion Tenderness - Abdomen is non-tender to palpation. Rigidity (guarding) - Abdomen is soft. Auscultation Auscultation of the abdomen reveals - Bowel  sounds normal.  Male Genitourinary Note: Not done, not pertinent to present illness   Musculoskeletal Note: On physical exam he is alert and oriented. He is in no acute distress. In regard to the left hip, range of motion is still intact. He has flexion to about 110 degrees, internal rotation 20 degrees, external rotation 30 degrees, and abduction 40 degrees. Presently he does actually have some slight discomfort with passive and active range of motion, primarily with internal rotation. No specific tenderness to palpation over the incisional area. The incision itself is healed. No signs of infection; however, he does have an overlying area of erythema. It is almost a scaling pattern, almost with a psoriatic type appearance. Once again, there is no active drainage from this site. He does have swelling over the area from the fluid collections. Calves are soft and nontender. Distal pulse is 2+. Sensation and motor function intact in the lower extremities.   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 Lequita Halt.  Plan is to go home.  PCP - Dr. Pearson Grippe  The patient does not have any contraindications and will receive TXA (tranexamic acid) prior to surgery.   Signed electronically by Beckey Rutter, III PA-C

## 2014-07-12 NOTE — Interval H&P Note (Signed)
History and Physical Interval Note:  07/12/2014 11:51 AM  Brad LentoJames G Shostak  has presented today for surgery, with the diagnosis of LEFT HIP BEARING SURFACE FAILURE  The various methods of treatment have been discussed with the patient and family. After consideration of risks, benefits and other options for treatment, the patient has consented to  Procedure(s): LEFT TOTAL HIP REVISION VS BEARING SURFACE REVISION (Left) as a surgical intervention .  The patient's history has been reviewed, patient examined, no change in status, stable for surgery.  I have reviewed the patient's chart and labs.  Questions were answered to the patient's satisfaction.     Loanne DrillingALUISIO,Boyd Litaker V

## 2014-07-12 NOTE — Anesthesia Postprocedure Evaluation (Signed)
Anesthesia Post Note  Patient: Brad Campbell  Procedure(s) Performed: Procedure(s) (LRB): LEFT TOTAL HIP REVISION  (Left)  Anesthesia type: General  Patient location: PACU  Post pain: Pain level controlled  Post assessment: Post-op Vital signs reviewed  Last Vitals: BP 126/92  Pulse 91  Temp(Src) 36.6 C (Oral)  Resp 16  Ht 6\' 4"  (1.93 m)  Wt 258 lb 8 oz (117.255 kg)  BMI 31.48 kg/m2  SpO2 100%  Post vital signs: Reviewed  Level of consciousness: sedated  Complications: No apparent anesthesia complications

## 2014-07-12 NOTE — H&P (Signed)
Brad Campbell DOB: 31-Aug-1961 Married / Language: English / Race: White Male Date of Admission:  07/12/2014 Chief complaint:  Left Hip Pain History of Present Illness  The patient is a 53 year old male who comes in for a preoperative History and Physical. The patient is scheduled for a left total hip arthroplasty (revision versus left hip bearing surface exchange) to be performed by Dr. Gus RankinFrank V. Aluisio, MD at [facility] on 07-12-2014. The patient is a 53 year old male who presents for follow up of their hip. The patient is being followed for their left incisional swelling. Symptoms reported today include: pain and swelling. The patient feels that they are doing poorly and report their pain level to be 7 / 10. The following medication has been used for pain control: antiinflammatory medication. He descibes the pain as lt. buttock pain that goes to the thigh. He tried to play golf the other day and after 9 holes had more pain that went from the buttock to the thigh. He gets fatigued. Denies numbness. [name] comes in for recheck of his left hip pain. 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 He is continuing to have quite a bit of discomfort. He did recently go play golf. He did not have any specific injury, but he does report that with increased activity he has increased discomfort. He is even feeling that his altered gait is causing some low back discomfort. He denies fevers and chills. His wife feels that he is having increased swelling over that area and feels that the redness that he had over the incision area has become more of a problem. They have been treated conservatively in the past for the above stated problem and despite conservative measures, they continue to have progressive pain and severe functional limitations and dysfunction.  They have failed non-operative management including home exercise, medications in the form of NSAIDs. It is felt that they would benefit from undergoing revision of the total joint replacement. Risks and benefits of the procedure have been discussed with the patient and they elect to proceed with surgery. There are no active contraindications to surgery such as ongoing infection or rapidly progressive neurological disease.  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 Depression Mother, Sister. Hypertension Mother.  Social History Tobacco use Former smoker. 03/08/2014 Current work status working full time No history of drug/alcohol rehab Living situation live with spouse Marital status married Not under pain contract Number of flights of stairs before winded 2-3 Children 3 Current drinker 03/08/2014: Currently drinks beer only occasionally per week Exercise Exercises monthly; does running / walking Post-Surgical Plans Home  Medication History  Simvastatin (20MG  Tablet, Oral) Active. Levothyroxine Sodium (75MCG Tablet, Oral) Active.  Past Surgical History  Total Hip Replacement left  Review of Systems  General Not Present- Chills, Fatigue, Fever, Memory Loss, Night Sweats, Weight Gain and Weight Loss. Skin Not Present- Eczema, Hives, Itching, Lesions and Rash. HEENT Not Present- Dentures, Double Vision, Headache, Hearing Loss, Tinnitus and Visual Loss. Respiratory Not Present- Allergies, Chronic Cough, Coughing up blood, Shortness of breath at rest and Shortness of breath with exertion. Cardiovascular Not Present- Chest  Pain, Difficulty Breathing Lying Down, Murmur, Palpitations,  Racing/skipping heartbeats and Swelling. Gastrointestinal Not Present- Abdominal Pain, Bloody Stool, Constipation, Diarrhea, Difficulty Swallowing, Heartburn, Jaundice, Loss of appetitie, Nausea and Vomiting. Male Genitourinary Not Present- Blood in Urine, Discharge, Flank Pain, Incontinence, Painful Urination, Urgency, Urinary frequency, Urinary Retention, Urinating at Night and Weak urinary stream. Musculoskeletal Present- Joint Pain and Morning Stiffness. Not Present- Back Pain, Joint Swelling, Muscle Pain, Muscle Weakness and Spasms. Neurological Not Present- Blackout spells, Difficulty with balance, Dizziness, Paralysis, Tremor and Weakness. Psychiatric Not Present- Insomnia.   Vitals Weight: 255 lb Height: 76in Weight was reported by patient. Height was reported by patient. Body Surface Area: 2.49 m Body Mass Index: 31.04 kg/m Pulse: 72 (Regular)  BP: 124/88 (Sitting, Left Arm, Standard)    Physical Exam  General Mental Status -Alert, cooperative and good historian. General Appearance-pleasant, Not in acute distress. Orientation-Oriented X3. Build & Nutrition-Well nourished and Well developed(tall framed).  Head and Neck Head-normocephalic, atraumatic . Neck Global Assessment - supple, no bruit auscultated on the right, no bruit auscultated on the left.  Eye Vision-Wears contact lenses. Pupil - Bilateral-PERR Motion - Bilateral-EOMI.  Chest and Lung Exam Auscultation Breath sounds - clear at anterior chest wall and clear at posterior chest wall. Adventitious sounds - No Adventitious sounds.  Cardiovascular Auscultation Rhythm - Regular rate and rhythm. Heart Sounds - S1 WNL and S2 WNL. Murmurs & Other Heart Sounds - Auscultation of the heart reveals - No Murmurs.  Abdomen Palpation/Percussion Tenderness - Abdomen is non-tender to palpation. Rigidity (guarding) - Abdomen is soft. Auscultation Auscultation of the abdomen reveals - Bowel  sounds normal.  Male Genitourinary Note: Not done, not pertinent to present illness   Musculoskeletal Note: On physical exam he is alert and oriented. He is in no acute distress. In regard to the left hip, range of motion is still intact. He has flexion to about 110 degrees, internal rotation 20 degrees, external rotation 30 degrees, and abduction 40 degrees. Presently he does actually have some slight discomfort with passive and active range of motion, primarily with internal rotation. No specific tenderness to palpation over the incisional area. The incision itself is healed. No signs of infection; however, he does have an overlying area of erythema. It is almost a scaling pattern, almost with a psoriatic type appearance. Once again, there is no active drainage from this site. He does have swelling over the area from the fluid collections. Calves are soft and nontender. Distal pulse is 2+. Sensation and motor function intact in the lower extremities.   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 Lequita Halt.  Plan is to go home.  PCP - Dr. Pearson Grippe  The patient does not have any contraindications and will receive TXA (tranexamic acid) prior to surgery.   Signed electronically by Beckey Rutter, III PA-C

## 2014-07-12 NOTE — Brief Op Note (Signed)
07/12/2014  2:24 PM  PATIENT:  Brad Campbell  53 y.o. male  PRE-OPERATIVE DIAGNOSIS:  LEFT HIP BEARING SURFACE FAILURE  POST-OPERATIVE DIAGNOSIS:  left hip bearing surface failure  PROCEDURE:  Procedure(s): LEFT TOTAL HIP REVISION  (Left)  SURGEON:  Surgeon(s) and Role:    * Loanne DrillingFrank Deontez Klinke V, MD - Primary  PHYSICIAN ASSISTANT:   ASSISTANTS: Avel Peacerew Perkins, PA-C   ANESTHESIA:   general  EBL:  Total I/O In: 1000 [I.V.:1000] Out: 450 [Urine:200; Blood:250]  BLOOD ADMINISTERED:none  DRAINS: (Medium) Hemovact drain(s) in the left hip with  Suction Open   LOCAL MEDICATIONS USED:  OTHER Exparel  COUNTS:  YES  TOURNIQUET:  * No tourniquets in log *  DICTATION: .Other Dictation: Dictation Number [id number]  PLAN OF CARE: Admit to inpatient   PATIENT DISPOSITION:  PACU - hemodynamically stable.

## 2014-07-12 NOTE — Anesthesia Preprocedure Evaluation (Signed)
Anesthesia Evaluation  Patient identified by MRN, date of birth, ID band Patient awake    Reviewed: Allergy & Precautions, H&P , NPO status , Patient's Chart, lab work & pertinent test results  Airway Mallampati: II TM Distance: >3 FB Neck ROM: Full    Dental  (+) Dental Advisory Given   Pulmonary former smoker,  breath sounds clear to auscultation        Cardiovascular negative cardio ROS  Rhythm:Regular Rate:Normal     Neuro/Psych negative neurological ROS  negative psych ROS   GI/Hepatic negative GI ROS, Neg liver ROS,   Endo/Other  negative endocrine ROS  Renal/GU negative Renal ROS     Musculoskeletal negative musculoskeletal ROS (+)   Abdominal   Peds  Hematology negative hematology ROS (+)   Anesthesia Other Findings   Reproductive/Obstetrics                           Anesthesia Physical Anesthesia Plan  ASA: II  Anesthesia Plan:    Post-op Pain Management:    Induction:   Airway Management Planned:   Additional Equipment:   Intra-op Plan:   Post-operative Plan:   Informed Consent: I have reviewed the patients History and Physical, chart, labs and discussed the procedure including the risks, benefits and alternatives for the proposed anesthesia with the patient or authorized representative who has indicated his/her understanding and acceptance.   Dental advisory given  Plan Discussed with: CRNA  Anesthesia Plan Comments:         Anesthesia Quick Evaluation

## 2014-07-12 NOTE — Transfer of Care (Signed)
Immediate Anesthesia Transfer of Care Note  Patient: Brad Campbell  Procedure(s) Performed: Procedure(s): LEFT TOTAL HIP REVISION  (Left)  Patient Location: PACU  Anesthesia Type:General  Level of Consciousness: awake, alert  and oriented  Airway & Oxygen Therapy: Patient Spontanous Breathing and Patient connected to face mask oxygen  Post-op Assessment: Report given to PACU RN and Post -op Vital signs reviewed and stable  Post vital signs: Reviewed and stable  Complications: No apparent anesthesia complications

## 2014-07-13 LAB — BASIC METABOLIC PANEL
Anion gap: 12 (ref 5–15)
BUN: 12 mg/dL (ref 6–23)
CALCIUM: 9 mg/dL (ref 8.4–10.5)
CO2: 26 meq/L (ref 19–32)
Chloride: 100 mEq/L (ref 96–112)
Creatinine, Ser: 0.81 mg/dL (ref 0.50–1.35)
GFR calc Af Amer: >90 mL/min (ref >90)
GFR calc non Af Amer: >90 mL/min (ref >90)
GLUCOSE: 145 mg/dL — AB (ref 70–99)
POTASSIUM: 4.4 meq/L (ref 3.7–5.3)
Sodium: 138 mEq/L (ref 137–147)

## 2014-07-13 LAB — CBC
HCT: 38.8 % — ABNORMAL LOW (ref 39.0–52.0)
HEMOGLOBIN: 12.5 g/dL — AB (ref 13.0–17.0)
MCH: 27.6 pg (ref 26.0–34.0)
MCHC: 32.2 g/dL (ref 30.0–36.0)
MCV: 85.7 fL (ref 78.0–100.0)
Platelets: 232 10*3/uL (ref 150–400)
RBC: 4.53 MIL/uL (ref 4.22–5.81)
RDW: 13.8 % (ref 11.5–15.5)
WBC: 16.7 10*3/uL — ABNORMAL HIGH (ref 4.0–10.5)

## 2014-07-13 MED ORDER — TRAMADOL HCL 50 MG PO TABS: 50.0000 mg | ORAL_TABLET | Freq: Four times a day (QID) | ORAL | Status: DC | PRN

## 2014-07-13 MED ORDER — RIVAROXABAN 10 MG PO TABS: 10.0000 mg | ORAL_TABLET | Freq: Every day | ORAL | Status: DC

## 2014-07-13 MED ORDER — METHOCARBAMOL 500 MG PO TABS: 500.0000 mg | ORAL_TABLET | Freq: Four times a day (QID) | ORAL | Status: DC | PRN

## 2014-07-13 MED ORDER — OXYCODONE HCL 5 MG PO TABS: 5.0000 mg | ORAL_TABLET | ORAL | Status: DC | PRN

## 2014-07-13 NOTE — Progress Notes (Signed)
Physical Therapy Treatment Patient Details Name: Brad LentoJames G Pinegar MRN: [id number] DOB: [birth year] Today's Date: 07/13/2014    History of Present Illness Pt was admitted for posterior THR.      PT Comments    Pt progressing well.  Reviewed stairs and car transfers with pt and spouse  Follow Up Recommendations  Home health PT     Equipment Recommendations  Rolling walker with 5" wheels;3in1 (PT)    Recommendations for Other Services OT consult     Precautions / Restrictions Precautions Precautions: Posterior Hip Precaution Comments: Pt recalls THP Restrictions Weight Bearing Restrictions: No Other Position/Activity Restrictions: WBAT    Mobility  Bed Mobility Overal bed mobility:  (Pt states he is comfortable with ability)                Transfers Overall transfer level: Needs assistance Equipment used: Rolling walker (2 wheeled) Transfers: Sit to/from Stand Sit to Stand: Supervision         General transfer comment: cues for LE management and use of UEs to self assist  Ambulation/Gait Ambulation/Gait assistance: Min guard;Supervision Ambulation Distance (Feet): 123 Feet Assistive device: Rolling walker (2 wheeled) Gait Pattern/deviations: Step-to pattern;Step-through pattern;Shuffle;Trunk flexed     General Gait Details: min cues for LE posture, position from RW and initial sequence   Stairs Stairs: Yes Stairs assistance: Min assist Stair Management: No rails;One rail Left;Step to pattern;Forwards;With walker;With crutches Number of Stairs: 10 General stair comments: full flight with crutch and rail; single step with RW  Wheelchair Mobility    Modified Rankin (Stroke Patients Only)       Balance                                    Cognition Arousal/Alertness: Awake/alert Behavior During Therapy: WFL for tasks assessed/performed Overall Cognitive Status: Within Functional Limits for tasks assessed                       Exercises      General Comments        Pertinent Vitals/Pain 4/10; Meds requested    Home Living                      Prior Function            PT Goals (current goals can now be found in the care plan section) Acute Rehab PT Goals Patient Stated Goal: Back to work ASAP PT Goal Formulation: With patient Time For Goal Achievement: 07/18/14 Potential to Achieve Goals: Good Progress towards PT goals: Progressing toward goals    Frequency  7X/week    PT Plan Current plan remains appropriate    Co-evaluation             End of Session Equipment Utilized During Treatment: Gait belt Activity Tolerance: Patient tolerated treatment well Patient left: in chair;with call bell/phone within reach;with family/visitor present     Time: [id number] PT Time Calculation (min): 28 min  Charges:  $Gait Training: 8-22 mins $Therapeutic Activity: 8-22 mins                    G Codes:      Marli Diego 07/13/2014, 4:52 PM

## 2014-07-13 NOTE — Care Management Note (Signed)
    Page 1 of 2   07/13/2014     10:43:23 AM CARE MANAGEMENT NOTE 07/13/2014  Patient:  Brad Campbell,Brad Campbell   Account Number:  [id number]  Date Initiated:  07/13/2014  Documentation initiated by:  [facility]Kimmie Ber[facility]ren  Subjective/Objective Assessment:   adm: LEFT TOTAL HIP REVISION  (Left)     Action/Plan:   discharge planning   Anticipated DC Date:  07/13/2014   Anticipated DC Plan:  HOME W HOME HEALTH SERVICES      DC Planning Services  CM consult      [facility]   Choice offered to / List presented to:  C-3 Spouse   DME arranged  3-N-1  [facility]      DME agency  [facility]     HH arranged  HH-2 PT      [facility]  [facility]   Status of service:  Completed, signed off Medicare Important Message given?   (If response is "NO", the following Medicare IM given date fields will be blank) Date Medicare IM given:   Medicare IM given by:   Date Additional Medicare IM given:   Additional Medicare IM given by:    Discharge Disposition:  HOME W HOME HEALTH SERVICES  Per UR Regulation:    If discussed at Long Length of Stay Meetings, dates discussed:    Comments:  07/13/14 10:35 CM spoke with spouse of pt to notify pt 3n1 only comes in one height (regardless of bari or not - and pt does not meet bari criteria anyway); CM discussed choice for HHPT. Pt and spouse choose [facility].  CM called DME delivery rep with [facility] who states he will deliver RW and 3n1 to room prior to dishcarge.  CM called [facility], Dona for referral for HHPT.  Address and contact informaiton verified with pt's spouse.  No other CM needs were communicated.  Freddy JakschSarah Marice Guidone, BSN, CM [phone].

## 2014-07-13 NOTE — Progress Notes (Signed)
   Subjective: 1 Day Post-Op Procedure(s) (LRB): LEFT TOTAL HIP REVISION  (Left) Patient reports pain as mild.   We will start therapy today.  Plan is to go Home after hospital stay.  Objective: Vital signs in last 24 hours: Temp:  [97.8 F (36.6 C)-98.6 F (37 C)] 98 F (36.7 C) (07/18 0616) Pulse Rate:  [90-103] 90 (07/18 0616) Resp:  [14-18] 18 (07/18 0616) BP: (106-132)/(69-92) 114/69 mmHg (07/18 0616) SpO2:  [92 %-100 %] 95 % (07/18 0616) Weight:  [258 lb 8 oz (117.255 kg)] 258 lb 8 oz (117.255 kg) (07/17 1013)  Intake/Output from previous day:  Intake/Output Summary (Last 24 hours) at 07/13/14 0741 Last data filed at 07/13/14 0616  Gross per 24 hour  Intake 4238.33 ml  Output   3810 ml  Net 428.33 ml    Intake/Output this shift:    Labs:  Recent Labs  07/13/14 0523  HGB 12.5*    Recent Labs  07/13/14 0523  WBC 16.7*  RBC 4.53  HCT 38.8*  PLT 232    Recent Labs  07/13/14 0523  NA 138  K 4.4  CL 100  CO2 26  BUN 12  CREATININE 0.81  GLUCOSE 145*  CALCIUM 9.0   No results found for this basename: LABPT, INR,  in the last 72 hours  EXAM General - Patient is Alert, Appropriate and Oriented Extremity - Neurologically intact Neurovascular intact No cellulitis present Compartment soft Dressing - dressing C/D/I Motor Function - intact, moving foot and toes well on exam.  Hemovac pulled without difficulty.  Past Medical History  Diagnosis Date  . Hyperlipidemia   . Arthritis   . Metallosis     L HIP  . Itching     ALONG INCISION LINE L HIP  . Thyroid disease     Assessment/Plan: 1 Day Post-Op Procedure(s) (LRB): LEFT TOTAL HIP REVISION  (Left) Principal Problem:   Failed total hip arthroplasty Active Problems:   OA (osteoarthritis) of hip   Advance diet Up with therapy D/C IV fluids Plan for discharge tomorrow  DVT Prophylaxis - Xarelto Weight Bearing As Tolerated left Leg Hemovac Pulled Begin Therapy Hip  Preacutions  Loanne DrillingALUISIO,Johnattan Strassman V

## 2014-07-13 NOTE — Discharge Instructions (Signed)
°Dr. Mihaela Fajardo °Total Joint Specialist °[facility] °[address] °[place], [place] [address] °[phone] ° ° °TOTAL HIP REPLACEMENT POSTOPERATIVE DIRECTIONS ° ° ° °Hip Rehabilitation, Guidelines Following Surgery  °The results of a hip operation are greatly improved after range of motion and muscle strengthening exercises. Follow all safety measures which are given to protect your hip. If any of these exercises cause increased pain or swelling in your joint, decrease the amount until you are comfortable again. Then slowly increase the exercises. Call your caregiver if you have problems or questions.  °HOME CARE INSTRUCTIONS  °Most of the following instructions are designed to prevent the dislocation of your new hip.  °Remove items at home which could result in a fall. This includes throw rugs or furniture in walking pathways.  °Continue medications as instructed at time of discharge. °· You may have some home medications which will be placed on hold until you complete the course of blood thinner medication. °· You may start showering once you are discharged home but do not submerge the incision under water. Just pat the incision dry and apply a dry gauze dressing on daily. °Do not put on socks or shoes without following the instructions of your caregivers.  °Sit on high chairs so your hips are not bent more than 90 degrees.  °Sit on chairs with arms. Use the chair arms to help push yourself up when arising.  °Keep your leg on the side of the operation out in front of you when standing up.  °Arrange for the use of a toilet seat elevator so you are not sitting low.  °Do not do any exercises or get in any positions that cause your toes to point in (pigeon toed).  °Always sleep with a pillow between your legs. Do not lie on your side in sleep with both knees touching the bed.  °· Walk with walker as instructed.  °You may resume a sexual relationship in one month or when given the OK by  your caregiver.  °Use walker as long as suggested by your caregivers.  °You may put full weight on your legs and walk as much as is comfortable. °Avoid periods of inactivity such as sitting longer than an hour when not asleep. This helps prevent blood clots.  °You may return to work once you are cleared by your surgeon.  °Do not drive a car for 6 weeks or until released by your surgeon.  °Do not drive while taking narcotics.  °Wear elastic stockings for three weeks following surgery during the day but you may remove then at night.  °Make sure you keep all of your appointments after your operation with all of your doctors and caregivers. You should call the office at the above phone number and make an appointment for approximately two weeks after the date of your surgery. °Change the dressing daily and reapply a dry dressing each time. °Please pick up a stool softener and laxative for home use as long as you are requiring pain medications. °· Continue to use ice on the hip for pain and swelling from surgery. You may notice swelling that will progress down to the foot and ankle.  This is normal after  surgery.  Elevate the leg when you are not up walking on it.   °It is important for you to complete the blood thinner medication as prescribed by your doctor. °· Continue to use the breathing machine which will help keep your temperature down.  It   is common for your temperature to cycle up and down following surgery, especially at night when you are not up moving around and exerting yourself.  The breathing machine keeps your lungs expanded and your temperature down. ° °RANGE OF MOTION AND STRENGTHENING EXERCISES  °These exercises are designed to help you keep full movement of your hip joint. Follow your caregiver's or physical therapist's instructions. Perform all exercises about fifteen times, three times per day or as directed. Exercise both hips, even if you have had only one joint replacement. These exercises can be  done on a training (exercise) mat, on the floor, on a table or on a bed. Use whatever works the best and is most comfortable for you. Use music or television while you are exercising so that the exercises are a pleasant break in your day. This will make your life better with the exercises acting as a break in routine you can look forward to.  °Lying on your back, slowly slide your foot toward your buttocks, raising your knee up off the floor. Then slowly slide your foot back down until your leg is straight again.  °Lying on your back spread your legs as far apart as you can without causing discomfort.  °Lying on your side, raise your upper leg and foot straight up from the floor as far as is comfortable. Slowly lower the leg and repeat.  °Lying on your back, tighten up the muscle in the front of your thigh (quadriceps muscles). You can do this by keeping your leg straight and trying to raise your heel off the floor. This helps strengthen the largest muscle supporting your knee.  °Lying on your back, tighten up the muscles of your buttocks both with the legs straight and with the knee bent at a comfortable angle while keeping your heel on the floor.  ° °SKILLED REHAB INSTRUCTIONS: °If the patient is transferred to a skilled rehab facility following release from the hospital, a list of the current medications will be sent to the facility for the patient to continue.  When discharged from the skilled rehab facility, please have the facility set up the patient's Home Health Physical Therapy prior to being released. Also, the skilled facility will be responsible for providing the patient with their medications at time of release from the facility to include their pain medication, the muscle relaxants, and their blood thinner medication. If the patient is still at the rehab facility at time of the two week follow up appointment, the skilled rehab facility will also need to assist the patient in arranging follow up  appointment in our office and any transportation needs. ° °MAKE SURE YOU:  °Understand these instructions.  °Will watch your condition.  °Will get help right away if you are not doing well or get worse. ° °Pick up stool softner and laxative for home. °Do not submerge incision under water. °May shower. °Continue to use ice for pain and swelling from surgery. °Hip precautions.  °Total Hip Protocol. ° °

## 2014-07-13 NOTE — Evaluation (Signed)
Physical Therapy Evaluation Patient Details Name: Brad Campbell MRN: [id number] DOB: 08/29/1961 Today's Date: 07/13/2014   History of Present Illness  Pt was admitted for posterior THR.    Clinical Impression  Pt s/p L THR revision presents with decreased L LE strength/ROM, post op pain, and posterior THR limiting functional mobility.  Pt should progress well to d/c home with family assist and HHPT follow up.    Follow Up Recommendations Home health PT    Equipment Recommendations  Rolling walker with 5" wheels;3in1 (PT)    Recommendations for Other Services OT consult     Precautions / Restrictions Precautions Precautions: Posterior Hip Precaution Comments: Pt recalls THP Restrictions Weight Bearing Restrictions: No Other Position/Activity Restrictions: WBAT      Mobility  Bed Mobility Overal bed mobility: Needs Assistance Bed Mobility: Supine to Sit     Supine to sit: Min assist     General bed mobility comments: cues for sequence and use of R LE to self assist  Transfers Overall transfer level: Needs assistance Equipment used: Rolling walker (2 wheeled) Transfers: Sit to/from Stand Sit to Stand: Min assist         General transfer comment: cues for LE management and use of UEs to self assist  Ambulation/Gait Ambulation/Gait assistance: Min assist;Min guard Ambulation Distance (Feet): 123 Feet Assistive device: Rolling walker (2 wheeled) Gait Pattern/deviations: Step-to pattern;Step-through pattern;Shuffle     General Gait Details: cues for LE posture, position from RW and initial sequence  Stairs            Wheelchair Mobility    Modified Rankin (Stroke Patients Only)       Balance                                             Pertinent Vitals/Pain 5/10; premed, ice packs provided    Home Living Family/patient expects to be discharged to:: Private residence Living Arrangements: Spouse/significant other Available  Help at Discharge: Family Type of Home: House Home Access: Stairs to enter   Secretary/administratorntrance Stairs-Number of Steps: 1 Home Layout: Able to live on main level with bedroom/bathroom Home Equipment: None      Prior Function Level of Independence: Independent with assistive device(s)         Comments: pt has been using sock aide and has not been bending forward     Hand Dominance   Dominant Hand: Right    Extremity/Trunk Assessment   Upper Extremity Assessment: Overall WFL for tasks assessed           Lower Extremity Assessment: LLE deficits/detail   LLE Deficits / Details: 3-/5 hip strength with AAROM at hip to 80 flex and 20 abd  Cervical / Trunk Assessment: Normal  Communication   Communication: No difficulties  Cognition Arousal/Alertness: Awake/alert Behavior During Therapy: WFL for tasks assessed/performed Overall Cognitive Status: Within Functional Limits for tasks assessed                      General Comments      Exercises Total Joint Exercises Ankle Circles/Pumps: AROM;Both;15 reps;Supine Quad Sets: AROM;10 reps;Supine;Both Heel Slides: AAROM;15 reps;Supine;Left Hip ABduction/ADduction: AAROM;Left;15 reps;Supine      Assessment/Plan    PT Assessment Patient needs continued PT services  PT Diagnosis Difficulty walking   PT Problem List Decreased strength;Decreased range of motion;Decreased activity tolerance;Decreased mobility;Decreased knowledge  of use of DME;Pain  PT Treatment Interventions DME instruction;Gait training;Stair training;Functional mobility training;Therapeutic activities;Therapeutic exercise;Patient/family education   PT Goals (Current goals can be found in the Care Plan section) Acute Rehab PT Goals Patient Stated Goal: Back to work ASAP PT Goal Formulation: With patient Time For Goal Achievement: 07/18/14 Potential to Achieve Goals: Good    Frequency 7X/week   Barriers to discharge        Co-evaluation                End of Session Equipment Utilized During Treatment: Gait belt Activity Tolerance: Patient tolerated treatment well Patient left: in chair;with call bell/phone within reach;with family/visitor present Nurse Communication: Mobility status         Time: 0927-1010 PT Time Calculation (min): 43 min   Charges:   PT Evaluation $Initial PT Evaluation Tier I: 1 Procedure PT Treatments $Gait Training: 8-22 mins $Therapeutic Exercise: 8-22 mins $Therapeutic Activity: 8-22 mins   PT G Codes:          Brad Campbell 07/13/2014, 12:57 PM

## 2014-07-13 NOTE — Op Note (Signed)
NAME:  Brad Campbell, Brad Campbell              ACCOUNT NO.:  [id number]  MEDICAL RECORD NO.:  [id number]  LOCATION:  1615                         [facility]  PHYSICIAN:  Ollen GrossFrank Tsuneo Faison, M.D.    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  PROCEDURE:  Left total hip arthroplasty revision.  SURGEON:  Ollen GrossFrank Khamil Lamica, M.D.  ASSISTANT:  Alexzandrew L. Perkins, P.A.C.  ANESTHESIA:  General.  ESTIMATED BLOOD LOSS:  250 mL.  DRAINS:  Hemovac x1.  COMPLICATIONS:  None.  CONDITION:  Stable to recovery.  BRIEF CLINICAL NOTE:  Brad Campbell is a 53 year old male who had a left total hip arthroplasty done several years ago.  He has had progressively worsening lateral hip pain.  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  He presents now for revision of the bearing surface versus total hip arthroplasty revision.  PROCEDURE IN DETAIL:  After successful administration of general anesthetic, the patient was placed in the left lateral decubitus position with the left side up and held with a hip positioner.  The left lower extremity was isolated from his perineum with plastic drapes and then prepped and draped in the usual sterile fashion.  Previous posterolateral incisions were utilized.  Skin cut with a 10 blade through subcutaneous tissue to the level of the fascia lata.  Fascia lata was tremendously thickened.  I incised the fascia lata and about an inch to an inch and a half thick with a lot of pseudotumorous type tissue adherent underneath the fascia lata.  I developed a plane between the pseudotumor and muscle and excised the pseudotumor, first anterior then posterior and then inferior.  We also excised  superior. Fortunately, does not appear to be much if any of muscle damage.  The sciatic nerve had been palpated and protected prior to doing the posterior dissection.  Once these abnormal tissue was removed, I was able to gain access to the joint.  There was also tissue present around the joint.  We did send tissue for pathologic analysis as well as fluid for Gram stain, which was negative.  The hip was subsequently dislocated.  We then removed the femoral head off the femoral neck.  I then removed the soft tissue around the top of the prosthesis.  Given that this was S-ROM prosthesis, I decided to remove the stem and leave the sleeve intact.  There was damage to the trunnion of the femoral component thus I wanted to remove it.  I used the chisel to disrupt the interface between the stem and sleeve and was able to extract the stem. I subsequently reamed into the femoral canal up to 15.5 mm, so we would be able to place a new 20 x 15 stem.  We then retracted the femur anteriorly to gain acetabular exposure. Acetabular retractors were then placed.  I was able to remove the metal liner from the acetabular shell.  The shell was in excellent position. I then  removed the 2 dome screws from the shell also.  Shell was well fixed.  The metal liner was removed and then the area thoroughly irrigated.  The 36 mm neutral +4 marathon liner was then placed for the 58 mm acetabular shell.  It was locked into position and checked and found to be stable.  We then placed a new stem which was a 20 x 15 S-ROM stem with a 36+ 12 neck and matching native anteversion.  I impacted the stem and had a perfect fit.  We placed a trial 36+ 6 head and reduced the hip.  He has great stability with full extension, full external rotation, 70 degrees flexion, 40 degrees adduction, 90 degrees internal rotation, 90 degrees of flexion, and 70 degrees of internal rotation. By placing the left leg on top of the right leg, leg  lengths were found to be equal.  Hip was then dislocated.  Again, the permanent 36+ 6 ceramic head was placed.  Hip was reduced with the same stability parameters.  The wound was copiously irrigated with saline solution.  I removed any other abnormal appearing tissue.  The fascia lata was then closed over Hemovac drain with running #1 V-Loc suture.  A 20 mL of Exparel mixed with 40 mL of saline were injected into the fascia lata, gluteal muscles, and subcu tissues.  Additional 20 mL of 0.25% Marcaine was injected into the same tissues.  Subcu was closed with interrupted 2- 0 Vicryl and subcuticular running 4-0 Monocryl.  The incision was cleaned and dried.  Steri-Strips and a bulky sterile dressing were applied.  He was then placed into a knee immobilizer, awakened, and transported to recovery in stable condition.  Note that a surgical assistant was a medical necessity for this procedure to perform it in a safe and expeditious manner.  Surgical assistant was necessary for retraction of vital neurovascular structures while dissecting the pseudotumor off the normal tissue.  Also necessary for proper positioning of the limb to allow for extraction of the old prosthesis with placement of a new one.     Ollen Gross, M.D.     FA/MEDQ  D:  07/12/2014  T:  07/13/2014  Job:  [id number]

## 2014-07-13 NOTE — Progress Notes (Signed)
Pt stable... d/c instructions, equipment, and scripts given with no questions/concerns voiced by pt or wife.

## 2014-07-13 NOTE — Evaluation (Signed)
Occupational Therapy Evaluation Patient Details Name: Brad Campbell MRN: [id number] DOB: [birth year] Today's Date: 07/13/2014    History of Present Illness Pt was admitted for posterior THR.     Clinical Impression   This 53 year old man was admitted for the above surgery.  A brief OT evaluation was conducted focusing on education.  Pt has no further OT needs at this time.      Follow Up Recommendations  No OT follow up    Equipment Recommendations  3 in 1 bedside comode    Recommendations for Other Services       Precautions / Restrictions Precautions Precautions: Posterior Hip Restrictions Weight Bearing Restrictions: No      Mobility Bed Mobility                  Transfers                      Balance                                            ADL                                         General ADL Comments: pt is near baseline for adls:  he has been using sock aide.  He feels he will sponge bathe for a couple of days when he stays downstairs but wants to get upstair as soon as possible. Educated that 3:1 can double as a shower chair and also reminded him to stand for toilet hygiene. Reviewed shower sequence. Pt feels he is independent with AE.  Pt educated to avoid IR when shifting in chair:  repositioned for comfort.  Wife asked about donning ted hose with bag--reviewed with her.       Vision                     Perception     Praxis      Pertinent Vitals/Pain L hip sore--repositioned; already had bag of ice     Hand Dominance     Extremity/Trunk Assessment Upper Extremity Assessment Upper Extremity Assessment: Overall WFL for tasks assessed           Communication Communication Communication: No difficulties   Cognition Arousal/Alertness: Awake/alert Behavior During Therapy: WFL for tasks assessed/performed Overall Cognitive Status: Within Functional Limits for tasks assessed                     General Comments       Exercises       Shoulder Instructions      Home Living Family/patient expects to be discharged to:: Private residence Living Arrangements: Spouse/significant other                 Bathroom Shower/Tub: Tub/shower unit;Walk-in shower (shower is upstairs)   FirefighterBathroom Toilet: Standard                Prior Functioning/Environment Level of Independence: Independent with assistive device(s)        Comments: pt has been using sock aide and has not been bending forward    OT Diagnosis:     OT Problem List:     OT Treatment/Interventions:  OT Goals(Current goals can be found in the care plan section)    OT Frequency:     Barriers to D/C:            Co-evaluation              End of Session    Activity Tolerance:   Patient left:     Time: 1610-9604 OT Time Calculation (min): 13 min Charges:  OT General Charges $OT Visit: 1 Procedure OT Evaluation $Initial OT Evaluation Tier I: 1 Procedure G-Codes:    Brad Campbell 07/21/14, 11:19 AM  Marica Otter, OTR/L [phone] 21-Jul-2014

## 2014-07-13 NOTE — Progress Notes (Signed)
Pt transported via wheelchair to private vehicle by NT and family. 

## 2014-07-15 LAB — BODY FLUID CULTURE
CULTURE: NO GROWTH
Gram Stain: NONE SEEN

## 2014-07-16 ENCOUNTER — Encounter (HOSPITAL_COMMUNITY): Payer: Self-pay | Admitting: Orthopedic Surgery

## 2014-07-17 LAB — ANAEROBIC CULTURE: GRAM STAIN: NONE SEEN

## 2014-07-25 NOTE — Discharge Summary (Signed)
Physician Discharge Summary   Patient ID: Brad Campbell MRN: [id number] DOB/AGE: 07/01/1961 53 y.o.  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  Admission Diagnoses:  Past Medical History  Diagnosis Date  . Hyperlipidemia   . Arthritis   . Metallosis     L HIP  . Itching     ALONG INCISION LINE L HIP  . Thyroid disease    Discharge Diagnoses:   Principal Problem:   Failed total hip arthroplasty Active Problems:   OA (osteoarthritis) of hip  Estimated body mass index is 31.48 kg/(m^2) as calculated from the following:   Height as of this encounter: 6' 4"  (1.93 m).   Weight as of this encounter: 117.255 kg (258 lb 8 oz).  Procedure(s) (LRB): LEFT TOTAL HIP REVISION  (Left)   Consults: None  HPI: Brad Campbell is a 53 year old male who had a left total  hip arthroplasty done several years ago. He has had progressively  worsening lateral hip pain. 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 He presents now for  revision of the bearing surface versus total hip arthroplasty revision.   Laboratory Data: Admission on 07/12/2014, Discharged on 07/13/2014  Component Date Value Ref Range Status  . ABO/RH(D) 07/12/2014 A POS   Final  . Antibody Screen 07/12/2014 POS   Final  . Sample Expiration 07/12/2014 07/15/2014   Final  . DAT, IgG 07/12/2014 NEG   Final  . Specimen Description 07/12/2014 FLUID SYNOVIAL LEFT ON SWAB   Final  . Special Requests 07/12/2014 NONE   Final  . Gram Stain 07/12/2014    Final                   Value:NO ORGANISMS SEEN                         NO WBC SEEN                         Gram Stain Report Called to,Read Back By and Verified With: [email or web address],K @ 0254 ON [id number] BY POTEAT,S  . Report Status 07/12/2014 07/12/2014 FINAL   Final  . Specimen Description 07/12/2014 FLUID SYNOVIAL LEFT   Final  . Special Requests 07/12/2014 NONE    Final  . Gram Stain 07/12/2014    Final                   Value:NO WBC SEEN                         NO ORGANISMS SEEN                         Performed at [facility]  . Culture 07/12/2014    Final                   Value:NO ANAEROBES ISOLATED                         Performed at [facility]  . Report Status 07/12/2014 07/17/2014 FINAL   Final  . Specimen Description 07/12/2014 FLUID SYNOVIAL LEFT ON SWAB   Final  . Special Requests 07/12/2014 NONE   Final  . Gram Stain 07/12/2014    Final  Value:NO WBC SEEN                         NO ORGANISMS SEEN                         Performed at [facility]  . Culture 07/12/2014    Final                   Value:NO GROWTH 3 DAYS                         Performed at [facility]  . Report Status 07/12/2014 07/15/2014 FINAL   Final  . WBC 07/13/2014 16.7* 4.0 - 10.5 K/uL Final  . RBC 07/13/2014 4.53  4.22 - 5.81 MIL/uL Final  . Hemoglobin 07/13/2014 12.5* 13.0 - 17.0 g/dL Final  . HCT 07/13/2014 38.8* 39.0 - 52.0 % Final  . MCV 07/13/2014 85.7  78.0 - 100.0 fL Final  . MCH 07/13/2014 27.6  26.0 - 34.0 pg Final  . MCHC 07/13/2014 32.2  30.0 - 36.0 g/dL Final  . RDW 07/13/2014 13.8  11.5 - 15.5 % Final  . Platelets 07/13/2014 232  150 - 400 K/uL Final  . Sodium 07/13/2014 138  137 - 147 mEq/L Final  . Potassium 07/13/2014 4.4  3.7 - 5.3 mEq/L Final  . Chloride 07/13/2014 100  96 - 112 mEq/L Final  . CO2 07/13/2014 26  19 - 32 mEq/L Final  . Glucose, Bld 07/13/2014 145* 70 - 99 mg/dL Final  . BUN 07/13/2014 12  6 - 23 mg/dL Final  . Creatinine, Ser 07/13/2014 0.81  0.50 - 1.35 mg/dL Final  . Calcium 07/13/2014 9.0  8.4 - 10.5 mg/dL Final  . GFR calc non Af Amer 07/13/2014 >90  >90 mL/min Final  . GFR calc Af Amer 07/13/2014 >90  >90 mL/min Final   Comment: (NOTE)                          The eGFR has been calculated using the CKD EPI equation.                          This calculation has  not been validated in all clinical situations.                          eGFR's persistently <90 mL/min signify possible Chronic Kidney                          Disease.  Georgiann Hahn gap 07/13/2014 12  5 - 15 Final  Hospital Outpatient Visit on 07/05/2014  Component Date Value Ref Range Status  . MRSA, PCR 07/05/2014 NEGATIVE  NEGATIVE Final  . Staphylococcus aureus 07/05/2014 POSITIVE* NEGATIVE Final   Comment:                                 The Xpert SA Assay (FDA                          approved for NASAL specimens  in patients over 13 years of age),                          is one component of                          a comprehensive surveillance                          program.  Test performance has                          been validated by [employer] for patients greater                          than or equal to 35 year old.                          It is not intended                          to diagnose infection nor to                          guide or monitor treatment.  Marland Kitchen aPTT 07/05/2014 41* 24 - 37 seconds Final   Comment:                                 IF BASELINE aPTT IS ELEVATED,                          SUGGEST PATIENT RISK ASSESSMENT                          BE USED TO DETERMINE APPROPRIATE                          ANTICOAGULANT THERAPY.  . WBC 07/05/2014 10.9* 4.0 - 10.5 K/uL Final  . RBC 07/05/2014 4.93  4.22 - 5.81 MIL/uL Final  . Hemoglobin 07/05/2014 13.5  13.0 - 17.0 g/dL Final  . HCT 07/05/2014 42.6  39.0 - 52.0 % Final  . MCV 07/05/2014 86.4  78.0 - 100.0 fL Final  . MCH 07/05/2014 27.4  26.0 - 34.0 pg Final  . MCHC 07/05/2014 31.7  30.0 - 36.0 g/dL Final  . RDW 07/05/2014 13.9  11.5 - 15.5 % Final  . Platelets 07/05/2014 248  150 - 400 K/uL Final  . Sodium 07/05/2014 140  137 - 147 mEq/L Final  . Potassium 07/05/2014 4.4  3.7 - 5.3 mEq/L Final  . Chloride 07/05/2014 101  96 - 112 mEq/L Final  . CO2  07/05/2014 28  19 - 32 mEq/L Final  . Glucose, Bld 07/05/2014 80  70 - 99 mg/dL Final  . BUN 07/05/2014 14  6 - 23 mg/dL Final  . Creatinine, Ser 07/05/2014 0.91  0.50 - 1.35 mg/dL Final  . Calcium 07/05/2014 9.5  8.4 - 10.5 mg/dL Final  . Total  Protein 07/05/2014 8.0  6.0 - 8.3 g/dL Final  . Albumin 07/05/2014 3.7  3.5 - 5.2 g/dL Final  . AST 07/05/2014 20  0 - 37 U/L Final  . ALT 07/05/2014 19  0 - 53 U/L Final  . Alkaline Phosphatase 07/05/2014 98  39 - 117 U/L Final  . Total Bilirubin 07/05/2014 0.2* 0.3 - 1.2 mg/dL Final  . GFR calc non Af Amer 07/05/2014 >90  >90 mL/min Final  . GFR calc Af Amer 07/05/2014 >90  >90 mL/min Final   Comment: (NOTE)                          The eGFR has been calculated using the CKD EPI equation.                          This calculation has not been validated in all clinical situations.                          eGFR's persistently <90 mL/min signify possible Chronic Kidney                          Disease.  . Anion gap 07/05/2014 11  5 - 15 Final  . Prothrombin Time 07/05/2014 13.3  11.6 - 15.2 seconds Final  . INR 07/05/2014 1.01  0.00 - 1.49 Final  . Color, Urine 07/05/2014 YELLOW  YELLOW Final  . APPearance 07/05/2014 CLEAR  CLEAR Final  . Specific Gravity, Urine 07/05/2014 1.016  1.005 - 1.030 Final  . pH 07/05/2014 6.0  5.0 - 8.0 Final  . Glucose, UA 07/05/2014 NEGATIVE  NEGATIVE mg/dL Final  . Hgb urine dipstick 07/05/2014 NEGATIVE  NEGATIVE Final  . Bilirubin Urine 07/05/2014 NEGATIVE  NEGATIVE Final  . Ketones, ur 07/05/2014 NEGATIVE  NEGATIVE mg/dL Final  . Protein, ur 07/05/2014 NEGATIVE  NEGATIVE mg/dL Final  . Urobilinogen, UA 07/05/2014 1.0  0.0 - 1.0 mg/dL Final  . Nitrite 07/05/2014 NEGATIVE  NEGATIVE Final  . Leukocytes, UA 07/05/2014 NEGATIVE  NEGATIVE Final   MICROSCOPIC NOT DONE ON URINES WITH NEGATIVE PROTEIN, BLOOD, LEUKOCYTES, NITRITE, OR GLUCOSE <1000 mg/dL.     X-Rays:Dg Hip Complete Left  07/05/2014   CLINICAL DATA:   Revision LEFT total hip arthroplasty.  EXAM: LEFT HIP - COMPLETE 2+ VIEW  COMPARISON:  12/02/2010.  FINDINGS: LEFT total hip arthroplasty is present. No radiographic complicating features are identified. Severe RIGHT hip osteoarthritis is also noted. Pelvic rings appear intact.  IMPRESSION: No radiographic complications of LEFT total hip arthroplasty. Severe RIGHT hip osteoarthritis.   Electronically Signed   By: Dereck Ligas M.D.   On: 07/05/2014 12:52   Dg Pelvis Portable  07/12/2014   CLINICAL DATA:  53 year old male status post left hip arthroplasty revision. Initial encounter.  EXAM: PORTABLE PELVIS 1-2 VIEWS  COMPARISON:  12/02/2010.  FINDINGS: Portable AP supine view at 1514 hrs. Revised left total hip arthroplasty. Postoperative drain in place. Today hardware components appear intact and normally aligned on this AP view. No unexpected osseous changes identified.  IMPRESSION: Left total hip arthroplasty revision, no adverse features.   Electronically Signed   By: Lars Pinks M.D.   On: 07/12/2014 15:38    EKG:No orders found for this or any previous visit.   Hospital Course: Patient was admitted to the Hospital and taken to the OR and underwent  the above state procedure without complications.  Patient tolerated the procedure well and was later transferred to the recovery room and then to the orthopaedic floor for postoperative care.  They were given PO and IV analgesics for pain control following their surgery.  They were given 24 hours of postoperative antibiotics of  Anti-infectives   Start     Dose/Rate Route Frequency Ordered Stop   07/12/14 2000  ceFAZolin (ANCEF) IVPB 2 g/50 mL premix     2 g 100 mL/hr over 30 Minutes Intravenous Every 6 hours 07/12/14 1616 07/13/14 0234   07/12/14 1016  ceFAZolin (ANCEF) IVPB 2 g/50 mL premix     2 g 100 mL/hr over 30 Minutes Intravenous On call to O.R. 07/12/14 1016 07/12/14 1254     and started on DVT prophylaxis in the form of Xarelto.   PT and OT  were ordered for total hip protocol.  The patient was allowed to be WBAT with therapy. Discharge planning was consulted to help with postop disposition and equipment needs.  Patient had a good night on the evening of surgery.  They started to get up OOB with therapy on day one.  Hemovac drain was pulled without difficulty.  The knee immobilizer was removed and discontinued.  Patient was seen in rounds and setup for discharge as long as he did well with therapy.  The patient had two good therapy sessions and then was ready to go home.   Diet: Cardiac diet Activity:WBAT No bending hip over 90 degrees- A "L" Angle Do not cross legs Do not let foot roll inward When turning these patients a pillow should be placed between the patient's legs to prevent crossing. Patients should have the affected knee fully extended when trying to sit or stand from all surfaces to prevent excessive hip flexion. When ambulating and turning toward the affected side the affected leg should have the toes turned out prior to moving the walker and the rest of patient's body as to prevent internal rotation/ turning in of the leg. Abduction pillows are the most effective way to prevent a patient from not crossing legs or turning toes in at rest. If an abduction pillow is not ordered placing a regular pillow length wise between the patient's legs is also an effective reminder. It is imperative that these precautions be maintained so that the surgical hip does not dislocate. Follow-up:in 2 weeks Disposition - Home Discharged Condition: good      Medication List    STOP taking these medications       naproxen sodium 220 MG tablet  Commonly known as:  ANAPROX      TAKE these medications       levothyroxine 75 MCG tablet  Commonly known as:  SYNTHROID, LEVOTHROID  Take 75 mcg by mouth at bedtime.     methocarbamol 500 MG tablet  Commonly known as:  ROBAXIN  Take 1 tablet (500 mg total) by mouth every 6 (six) hours as  needed for muscle spasms.     oxyCODONE 5 MG immediate release tablet  Commonly known as:  Oxy IR/ROXICODONE  Take 1-2 tablets (5-10 mg total) by mouth every 3 (three) hours as needed for breakthrough pain.     rivaroxaban 10 MG Tabs tablet  Commonly known as:  XARELTO  Take 1 tablet (10 mg total) by mouth daily with breakfast.     simvastatin 20 MG tablet  Commonly known as:  ZOCOR  Take 20 mg by mouth at bedtime.  traMADol 50 MG tablet  Commonly known as:  ULTRAM  Take 1-2 tablets (50-100 mg total) by mouth every 6 (six) hours as needed for moderate pain.     Vitamin D 2000 UNITS tablet  Take 2,000 Units by mouth every Wednesday.     ZINC PO  Take 1 tablet by mouth every other day.           Follow-up Information   Follow up with Gearlean Alf, MD. Schedule an appointment as soon as possible for a visit on 07/25/2014. (Call [phone] Monday to make the appointment)    Specialty:  Orthopedic Surgery   Contact information:   [address] [place] [place] [address] [phone]       Follow up with [facility] (home health physical therapy)    Contact information:   [address] [place] [address] [phone]       Follow up with [facility]. (3n1 (commode) and rolling walker)    Contact information:   [place] [address] [phone]       Signed: Arlee Muslim, PA-C Orthopaedic Surgery 07/25/2014, 8:30 AM

## 2016-11-28 ENCOUNTER — Encounter (HOSPITAL_BASED_OUTPATIENT_CLINIC_OR_DEPARTMENT_OTHER): Payer: Self-pay | Admitting: *Deleted

## 2016-11-28 ENCOUNTER — Emergency Department (HOSPITAL_BASED_OUTPATIENT_CLINIC_OR_DEPARTMENT_OTHER): Payer: 59

## 2016-11-28 ENCOUNTER — Emergency Department (HOSPITAL_BASED_OUTPATIENT_CLINIC_OR_DEPARTMENT_OTHER)
Admission: EM | Admit: 2016-11-28 | Discharge: 2016-11-29 | Disposition: A | Discharge status: Home / Self Care | Payer: 59 | Attending: Emergency Medicine | Admitting: Emergency Medicine

## 2016-11-28 DIAGNOSIS — S76191A Other specified injury of right quadriceps muscle, fascia and tendon, initial encounter: Secondary | ICD-10-CM | POA: Diagnosis not present

## 2016-11-28 DIAGNOSIS — W0110XA Fall on same level from slipping, tripping and stumbling with subsequent striking against unspecified object, initial encounter: Secondary | ICD-10-CM | POA: Insufficient documentation

## 2016-11-28 DIAGNOSIS — Z87891 Personal history of nicotine dependence: Secondary | ICD-10-CM | POA: Diagnosis not present

## 2016-11-28 DIAGNOSIS — Y939 Activity, unspecified: Secondary | ICD-10-CM | POA: Insufficient documentation

## 2016-11-28 DIAGNOSIS — S8991XA Unspecified injury of right lower leg, initial encounter: Secondary | ICD-10-CM | POA: Diagnosis present

## 2016-11-28 DIAGNOSIS — Z23 Encounter for immunization: Secondary | ICD-10-CM | POA: Diagnosis not present

## 2016-11-28 DIAGNOSIS — Y999 Unspecified external cause status: Secondary | ICD-10-CM | POA: Insufficient documentation

## 2016-11-28 DIAGNOSIS — Y929 Unspecified place or not applicable: Secondary | ICD-10-CM | POA: Insufficient documentation

## 2016-11-28 DIAGNOSIS — S81011A Laceration without foreign body, right knee, initial encounter: Secondary | ICD-10-CM

## 2016-11-28 MED ORDER — ACETAMINOPHEN 325 MG PO TABS
650.0000 mg | ORAL_TABLET | Freq: Once | ORAL | Status: AC
  Administered 2016-11-28: 650 mg via ORAL
  Filled 2016-11-28: qty 2

## 2016-11-28 MED ORDER — TETANUS-DIPHTH-ACELL PERTUSSIS 5-2.5-18.5 LF-MCG/0.5 IM SUSP
0.5000 mL | Freq: Once | INTRAMUSCULAR | Status: AC
  Administered 2016-11-28: 0.5 mL via INTRAMUSCULAR
  Filled 2016-11-28: qty 0.5

## 2016-11-28 MED ORDER — LIDOCAINE-EPINEPHRINE (PF) 2 %-1:200000 IJ SOLN
INTRAMUSCULAR | Status: AC
  Filled 2016-11-28: qty 20

## 2016-11-28 NOTE — ED Notes (Signed)
EMT Jalissa Heinzelman Placed gauze and coban on the laceration of the Rt Knee for pressure and to control bleeding.

## 2016-11-28 NOTE — ED Notes (Signed)
Paged Orthopaedic Surgeon via answering service @ 11:10pm

## 2016-11-28 NOTE — ED Provider Notes (Signed)
[facility]-EMERGENCY DEPT Provider Note   CSN: [id number] Arrival date & time: 11/28/16  2016  By signing my name below, I, Linna [id number] Turner, attest that this documentation has been prepared under the direction and in the presence of Arvilla MeresAshley Nayra Coury, PA-C. Electronically Signed: Linna Darnerussell Turner, Scribe. 11/28/2016. 9:23 PM.  History   Chief Complaint Chief Complaint  Patient presents with  . Extremity Laceration    The history is provided by the patient. No language interpreter was used.     HPI Comments: Brad Campbell is a 55 y.o. male who presents to the Emergency Department complaining of a right knee laceration sustained ~ 5 hours ago. Pt reports he tripped over an object outside and struck his right knee on concrete. He endorses moderate right knee pain secondary to the wound. He denies hitting his head or losing consciousness.  He is ambulatory. No blood thinners. No h/o immunocompromising conditions. Tetanus status unknown. He denies fever, chills, numbness, weakness, nausea, vomiting, or any other associated symptoms.  Past Medical History:  Diagnosis Date  . Arthritis   . Hyperlipidemia   . Itching    ALONG INCISION LINE L HIP  . Metallosis    L HIP  . Thyroid disease     Patient Active Problem List   Diagnosis Date Noted  . Failed total hip arthroplasty (HCC) 07/12/2014  . OA (osteoarthritis) of hip 07/12/2014    Past Surgical History:  Procedure Laterality Date  . JOINT REPLACEMENT  11/2010   L TOTAL HIP  . TOTAL HIP REVISION Left 07/12/2014   Procedure: LEFT TOTAL HIP REVISION ;  Surgeon: Loanne DrillingFrank Aluisio V, MD;  Location: [facility] ORS;  Service: Orthopedics;  Laterality: Left;       Home Medications    Prior to Admission medications   Medication Sig Start Date End Date Taking? Authorizing Provider  cephALEXin (KEFLEX) 500 MG capsule Take 1 capsule (500 mg total) by mouth 4 (four) times daily. 11/29/16   Lona KettleAshley Laurel Kylah Maresh, PA-C  Cholecalciferol (VITAMIN D) 2000 UNITS  tablet Take 2,000 Units by mouth every Wednesday.    Historical Provider, MD  levothyroxine (SYNTHROID, LEVOTHROID) 75 MCG tablet Take 75 mcg by mouth at bedtime.    Historical Provider, MD  Multiple Vitamins-Minerals (ZINC PO) Take 1 tablet by mouth every other day.    Historical Provider, MD    Family History History reviewed. No pertinent family history.  Social History Social History  Substance Use Topics  . Smoking status: Former Smoker    Quit date: 07/05/2000  . Smokeless tobacco: Never Used  . Alcohol use Yes     Comment: OCCASIONAL     Allergies   Patient has no known allergies.   Review of Systems Review of Systems  Constitutional: Negative for chills and fever.  Gastrointestinal: Negative for nausea and vomiting.  Musculoskeletal: Positive for arthralgias (right knee secondary to wound).  Skin: Positive for wound (laceration to right knee). Negative for rash.  Neurological: Negative for syncope, weakness and numbness.     Physical Exam Updated Vital Signs BP 134/93 (BP Location: Right Arm)   Pulse 87   Temp 98.2 F (36.8 C) (Oral)   Resp 18   Ht 6\' 4"  (1.93 m)   Wt 115.7 kg   SpO2 95%   BMI 31.04 kg/m   Physical Exam  Constitutional: He appears well-developed and well-nourished. No distress.  HENT:  Head: Normocephalic and atraumatic.  Eyes: Conjunctivae are normal. No scleral icterus.  Neck: Normal range of motion.  Cardiovascular: Intact distal pulses.   Pulmonary/Chest: Effort normal. No respiratory distress.  Abdominal: He exhibits no distension.  Musculoskeletal:       Right knee: He exhibits laceration.  2cm laceration to left knee. Patella visualized through open wound. Partial oblique disruption of quadriceps tendon. SLR intact. ROM intact. Sensation intact. 2+ PT.   Neurological: He is alert.  Strength and sensation intact.  Skin: Skin is warm and dry. He is not diaphoretic.  2 cm laceration to the right knee with superficial abrasion.    Psychiatric: He has a normal mood and affect. His behavior is normal.     ED Treatments / Results  Labs (all labs ordered are listed, but only abnormal results are displayed) Labs Reviewed - No data to display  EKG  EKG Interpretation None       Radiology Dg Knee Complete 4 Views Right  Result Date: 11/28/2016 CLINICAL DATA:  Status post fall, with right anterior knee laceration. Initial encounter. EXAM: RIGHT KNEE - COMPLETE 4+ VIEW COMPARISON:  None. FINDINGS: There is no evidence of fracture or dislocation. The joint spaces are preserved. No significant degenerative change is seen; the patellofemoral joint is grossly unremarkable in appearance. No significant joint effusion is seen. A soft tissue laceration is noted anterior to the patella, with mild soft tissue air tracking along the quadriceps tendon. No radiopaque foreign bodies are seen. IMPRESSION: 1. No evidence of fracture or dislocation. 2. Soft tissue laceration anterior to the patella, with mild soft tissue air tracking along the quadriceps tendon. Electronically Signed   By: Roanna Raider M.D.   On: 11/28/2016 22:23    Procedures .Marland KitchenLaceration Repair Date/Time: 11/29/2016 3:43 PM Performed by: Lona Kettle Authorized by: Lona Kettle   Consent:    Consent obtained:  Verbal   Consent given by:  Patient   Risks discussed:  Infection, pain and need for additional repair   Alternatives discussed:  No treatment Anesthesia (see MAR for exact dosages):    Anesthesia method:  Local infiltration   Local anesthetic:  Lidocaine 2% WITH epi Laceration details:    Location:  Leg   Leg location:  R knee   Length (cm):  2 Repair type:    Repair type:  Simple Pre-procedure details:    Preparation:  Patient was prepped and draped in usual sterile fashion and imaging obtained to evaluate for foreign bodies Exploration:    Hemostasis achieved with:  Epinephrine and direct pressure   Wound exploration: wound  explored through full range of motion and entire depth of wound probed and visualized     Wound extent: tendon damage     Tendon damage location:  Lower extremity   Lower extremity tendon damage location:  Quadriceps tendon   Tendon damage extent:  Partial transection   Tendon repair plan:  Refer for evaluation   Contaminated: no   Treatment:    Area cleansed with:  Saline   Amount of cleaning:  Standard   Irrigation solution:  Sterile saline   Irrigation volume:  500   Irrigation method:  Pressure wash   Foreign body removal: no foreign bodies.   Skin repair:    Repair method:  Sutures   Suture size:  4-0   Suture material:  Prolene   Suture technique:  Simple interrupted   Number of sutures:  3 Approximation:    Approximation:  Close   Vermilion border: well-aligned   Post-procedure details:    Dressing:  Antibiotic ointment and adhesive  bandage   Patient tolerance of procedure:  Tolerated well, no immediate complications   (including critical care time)  DIAGNOSTIC STUDIES: Oxygen Saturation is 96% on RA, adequate by my interpretation.    COORDINATION OF CARE: 9:31 PM Discussed treatment plan with pt at bedside and pt agreed to plan.  Medications Ordered in ED Medications  Tdap (BOOSTRIX) injection 0.5 mL (0.5 mLs Intramuscular Given 11/28/16 2127)  acetaminophen (TYLENOL) tablet 650 mg (650 mg Oral Given 11/28/16 2148)  cephALEXin (KEFLEX) capsule 500 mg (500 mg Oral Given 11/29/16 0234)     Initial Impression / Assessment and Plan / ED Course  I have reviewed the triage vital signs and the nursing notes.  Pertinent labs & imaging results that were available during my care of the patient were reviewed by me and considered in my medical decision making (see chart for details).  Clinical Course as of Nov 29 1549  Sun Nov 28, 2016  2300 No obvious fracture or dislocation. Air noted in soft tissue tracking quadriceps tendon. Soft tissue laceration appreciated anterior  to patella.  DG Knee Complete 4 Views Right [AM]    Clinical Course User Index [AM] Lona KettleAshley Laurel Analee Montee, PA-C    Patient presents to ED with complaint of laceration to right knee s/p injury PTA. Patient is afebrile and non-toxic appearing in NAD. VSS. Physical exam remarkable for 2cm laceration to right knee. Bleeding is controlled. ROM and sensation intact. SLR intact. Pressure irrigation performed by me. Wound explored and base of wound visualized in a bloodless field without evidence of foreign body; however, oblique disruption of quadriceps tendon appreciated, extensor mechanism intact, doubt full tendon rupture. X-ray remarkable for air tracking along quadriceps tendon, no fracture or dislocation. Discussed pt with Dr. Eudelia Bunchardama who also evaluated patient. Will consult orthopedics regarding management.   Spoke with Dr. Magnus IvanBlackman of orthopedics on two separate occasions, greatly appreciate his time and input. Recommend irrigation of wound, closure, knee immobilizer, PO ABX, follow up OP in office.  Thank you for your continued care of this patient.   Discussed results and plan with patient. Laceration repaired. Laceration occurred < 8 hours prior to repair which was well tolerated.  Tdap updated.  Pt has  no comorbidities to effect normal wound healing. Pt discharged with ABX.  Discussed suture home care with patient and answered questions. Pt to follow up with ortho OP regarding quadriceps tendon disruption. Pt to follow-up for wound check and suture removal in 10-14 days; they are to return to the ED sooner for signs of infection. Strict return precautions given. Pt voiced understanding and is agreeable.   I personally performed the services described in this documentation, which was scribed in my presence. The recorded information has been reviewed and is accurate.   Final Clinical Impressions(s) / ED Diagnoses   Final diagnoses:  Laceration of right knee, initial encounter  Other specified  injury of right quadriceps muscle, fascia and tendon, initial encounter    New Prescriptions Discharge Medication List as of 11/29/2016  1:55 AM    START taking these medications   Details  cephALEXin (KEFLEX) 500 MG capsule Take 1 capsule (500 mg total) by mouth 4 (four) times daily., Starting Mon 11/29/2016, Print         [place] Grove[place]ley Laurel Glynna Failla, [place]PA-C 11/29/16 1556    Nira ConnPedro Eduardo Cardama, MD 12/02/16 [phone]

## 2016-11-28 NOTE — ED Triage Notes (Signed)
Laceration to right knee since 4pm today after falling outside.  Pressure dressing applied in triage.

## 2016-11-28 NOTE — ED Notes (Signed)
ED Provider at bedside. 

## 2016-11-29 MED ORDER — CEPHALEXIN 500 MG PO CAPS: 500.0000 mg | ORAL_CAPSULE | Freq: Four times a day (QID) | ORAL | 0 refills | Status: AC

## 2016-11-29 MED ORDER — CEPHALEXIN 250 MG PO CAPS
500.0000 mg | ORAL_CAPSULE | Freq: Once | ORAL | Status: AC
  Administered 2016-11-29: 500 mg via ORAL
  Filled 2016-11-29: qty 2

## 2016-11-29 NOTE — Discharge Instructions (Signed)
Read the information below.  Your x-ray did not show any fractures or dislocation. You have a partially disrupted right quadriceps tendon. You are being placed in a knee immobilizer and crutches. Please call the orthopedic office in the morning to schedule a follow up appointment. I have provided the contact information for Dr. Magnus IvanBlackman, you can also follow up with your established orthopedic provider if described.  Your wound was cleaned, closed, and a bandage applied. You are being placed on antibiotics. Keep bandage on for 24 hours. Following you can wash with warm soap and water. Reapply antibiotic ointment and bandage.  Look for signs of infection - redness, swelling, warmth, purulent drainage, increasing pain, streaking, or fever.  You can take tylenol or motrin for pain relief.  You may return to the Emergency Department at any time for worsening condition or any new symptoms that concern you.

## 2017-01-08 DIAGNOSIS — H16203 Unspecified keratoconjunctivitis, bilateral: Secondary | ICD-10-CM | POA: Diagnosis not present

## 2017-01-20 ENCOUNTER — Other Ambulatory Visit: Payer: Self-pay | Admitting: Internal Medicine

## 2017-01-20 DIAGNOSIS — R1011 Right upper quadrant pain: Secondary | ICD-10-CM

## 2017-01-20 DIAGNOSIS — R1013 Epigastric pain: Secondary | ICD-10-CM

## 2017-01-20 DIAGNOSIS — K6389 Other specified diseases of intestine: Secondary | ICD-10-CM | POA: Diagnosis not present

## 2017-01-20 DIAGNOSIS — K5790 Diverticulosis of intestine, part unspecified, without perforation or abscess without bleeding: Secondary | ICD-10-CM | POA: Diagnosis not present

## 2017-01-20 DIAGNOSIS — R1010 Upper abdominal pain, unspecified: Secondary | ICD-10-CM | POA: Diagnosis not present

## 2017-01-20 DIAGNOSIS — R1084 Generalized abdominal pain: Secondary | ICD-10-CM

## 2017-01-20 DIAGNOSIS — K4090 Unilateral inguinal hernia, without obstruction or gangrene, not specified as recurrent: Secondary | ICD-10-CM | POA: Diagnosis not present

## 2017-01-21 DIAGNOSIS — E039 Hypothyroidism, unspecified: Secondary | ICD-10-CM | POA: Diagnosis not present

## 2017-01-21 DIAGNOSIS — R1013 Epigastric pain: Secondary | ICD-10-CM | POA: Diagnosis not present

## 2017-01-21 DIAGNOSIS — E7800 Pure hypercholesterolemia, unspecified: Secondary | ICD-10-CM | POA: Diagnosis not present

## 2017-03-02 DIAGNOSIS — E039 Hypothyroidism, unspecified: Secondary | ICD-10-CM | POA: Diagnosis not present

## 2017-03-02 DIAGNOSIS — K7689 Other specified diseases of liver: Secondary | ICD-10-CM | POA: Diagnosis not present

## 2017-03-02 DIAGNOSIS — E7800 Pure hypercholesterolemia, unspecified: Secondary | ICD-10-CM | POA: Diagnosis not present

## 2017-03-02 DIAGNOSIS — R1013 Epigastric pain: Secondary | ICD-10-CM | POA: Diagnosis not present

## 2017-03-09 DIAGNOSIS — E559 Vitamin D deficiency, unspecified: Secondary | ICD-10-CM | POA: Diagnosis not present

## 2017-03-09 DIAGNOSIS — E039 Hypothyroidism, unspecified: Secondary | ICD-10-CM | POA: Diagnosis not present

## 2017-03-09 DIAGNOSIS — E7800 Pure hypercholesterolemia, unspecified: Secondary | ICD-10-CM | POA: Diagnosis not present

## 2017-04-08 ENCOUNTER — Encounter: Payer: Self-pay | Admitting: Internal Medicine

## 2017-06-21 DIAGNOSIS — Z96642 Presence of left artificial hip joint: Secondary | ICD-10-CM | POA: Diagnosis not present

## 2017-06-21 DIAGNOSIS — M1611 Unilateral primary osteoarthritis, right hip: Secondary | ICD-10-CM | POA: Diagnosis not present

## 2017-06-21 DIAGNOSIS — M160 Bilateral primary osteoarthritis of hip: Secondary | ICD-10-CM | POA: Diagnosis not present

## 2017-06-21 DIAGNOSIS — Z471 Aftercare following joint replacement surgery: Secondary | ICD-10-CM | POA: Diagnosis not present

## 2017-10-06 DIAGNOSIS — E039 Hypothyroidism, unspecified: Secondary | ICD-10-CM | POA: Diagnosis not present

## 2017-10-06 DIAGNOSIS — E7800 Pure hypercholesterolemia, unspecified: Secondary | ICD-10-CM | POA: Diagnosis not present

## 2017-10-06 DIAGNOSIS — Z0000 Encounter for general adult medical examination without abnormal findings: Secondary | ICD-10-CM | POA: Diagnosis not present

## 2018-07-19 DIAGNOSIS — E559 Vitamin D deficiency, unspecified: Secondary | ICD-10-CM | POA: Diagnosis not present

## 2018-07-19 DIAGNOSIS — E039 Hypothyroidism, unspecified: Secondary | ICD-10-CM | POA: Diagnosis not present

## 2018-08-24 DIAGNOSIS — E039 Hypothyroidism, unspecified: Secondary | ICD-10-CM | POA: Diagnosis not present

## 2018-08-24 DIAGNOSIS — E559 Vitamin D deficiency, unspecified: Secondary | ICD-10-CM | POA: Diagnosis not present

## 2018-08-30 DIAGNOSIS — E039 Hypothyroidism, unspecified: Secondary | ICD-10-CM | POA: Diagnosis not present

## 2018-08-30 DIAGNOSIS — E559 Vitamin D deficiency, unspecified: Secondary | ICD-10-CM | POA: Diagnosis not present

## 2019-08-17 DIAGNOSIS — Z471 Aftercare following joint replacement surgery: Secondary | ICD-10-CM | POA: Diagnosis not present

## 2019-08-17 DIAGNOSIS — Z96642 Presence of left artificial hip joint: Secondary | ICD-10-CM | POA: Diagnosis not present

## 2019-08-17 DIAGNOSIS — M1611 Unilateral primary osteoarthritis, right hip: Secondary | ICD-10-CM | POA: Diagnosis not present

## 2019-08-17 DIAGNOSIS — M25551 Pain in right hip: Secondary | ICD-10-CM | POA: Diagnosis not present

## 2019-08-24 DIAGNOSIS — I10 Essential (primary) hypertension: Secondary | ICD-10-CM | POA: Diagnosis not present

## 2019-08-24 DIAGNOSIS — Z125 Encounter for screening for malignant neoplasm of prostate: Secondary | ICD-10-CM | POA: Diagnosis not present

## 2019-08-24 DIAGNOSIS — E559 Vitamin D deficiency, unspecified: Secondary | ICD-10-CM | POA: Diagnosis not present

## 2019-08-24 DIAGNOSIS — E538 Deficiency of other specified B group vitamins: Secondary | ICD-10-CM | POA: Diagnosis not present

## 2019-08-24 DIAGNOSIS — E039 Hypothyroidism, unspecified: Secondary | ICD-10-CM | POA: Diagnosis not present

## 2019-08-24 DIAGNOSIS — E785 Hyperlipidemia, unspecified: Secondary | ICD-10-CM | POA: Diagnosis not present

## 2019-08-27 DIAGNOSIS — E785 Hyperlipidemia, unspecified: Secondary | ICD-10-CM | POA: Diagnosis not present

## 2019-08-27 DIAGNOSIS — Z0000 Encounter for general adult medical examination without abnormal findings: Secondary | ICD-10-CM | POA: Diagnosis not present

## 2019-08-27 DIAGNOSIS — E039 Hypothyroidism, unspecified: Secondary | ICD-10-CM | POA: Diagnosis not present

## 2019-08-27 DIAGNOSIS — I10 Essential (primary) hypertension: Secondary | ICD-10-CM | POA: Diagnosis not present

## 2019-10-23 DIAGNOSIS — M1611 Unilateral primary osteoarthritis, right hip: Secondary | ICD-10-CM | POA: Diagnosis not present

## 2019-11-27 DIAGNOSIS — Z471 Aftercare following joint replacement surgery: Secondary | ICD-10-CM | POA: Diagnosis not present

## 2019-11-27 DIAGNOSIS — Z96641 Presence of right artificial hip joint: Secondary | ICD-10-CM | POA: Diagnosis not present

## 2020-02-18 DIAGNOSIS — E039 Hypothyroidism, unspecified: Secondary | ICD-10-CM | POA: Diagnosis not present

## 2020-02-18 DIAGNOSIS — E785 Hyperlipidemia, unspecified: Secondary | ICD-10-CM | POA: Diagnosis not present

## 2020-02-18 DIAGNOSIS — I10 Essential (primary) hypertension: Secondary | ICD-10-CM | POA: Diagnosis not present

## 2020-03-24 DIAGNOSIS — E559 Vitamin D deficiency, unspecified: Secondary | ICD-10-CM | POA: Diagnosis not present

## 2020-03-24 DIAGNOSIS — E7800 Pure hypercholesterolemia, unspecified: Secondary | ICD-10-CM | POA: Diagnosis not present

## 2020-03-24 DIAGNOSIS — E039 Hypothyroidism, unspecified: Secondary | ICD-10-CM | POA: Diagnosis not present

## 2020-09-22 DIAGNOSIS — E039 Hypothyroidism, unspecified: Secondary | ICD-10-CM | POA: Diagnosis not present

## 2020-09-22 DIAGNOSIS — Z0000 Encounter for general adult medical examination without abnormal findings: Secondary | ICD-10-CM | POA: Diagnosis not present

## 2020-09-22 DIAGNOSIS — E559 Vitamin D deficiency, unspecified: Secondary | ICD-10-CM | POA: Diagnosis not present

## 2020-09-22 DIAGNOSIS — I10 Essential (primary) hypertension: Secondary | ICD-10-CM | POA: Diagnosis not present

## 2020-09-22 DIAGNOSIS — E7800 Pure hypercholesterolemia, unspecified: Secondary | ICD-10-CM | POA: Diagnosis not present

## 2020-09-22 DIAGNOSIS — Z125 Encounter for screening for malignant neoplasm of prostate: Secondary | ICD-10-CM | POA: Diagnosis not present

## 2020-09-24 DIAGNOSIS — Z0000 Encounter for general adult medical examination without abnormal findings: Secondary | ICD-10-CM | POA: Diagnosis not present

## 2021-03-24 DIAGNOSIS — E7800 Pure hypercholesterolemia, unspecified: Secondary | ICD-10-CM | POA: Diagnosis not present

## 2021-09-28 DIAGNOSIS — Z125 Encounter for screening for malignant neoplasm of prostate: Secondary | ICD-10-CM | POA: Diagnosis not present

## 2021-09-28 DIAGNOSIS — E559 Vitamin D deficiency, unspecified: Secondary | ICD-10-CM | POA: Diagnosis not present

## 2021-09-28 DIAGNOSIS — E7800 Pure hypercholesterolemia, unspecified: Secondary | ICD-10-CM | POA: Diagnosis not present

## 2021-09-28 DIAGNOSIS — E039 Hypothyroidism, unspecified: Secondary | ICD-10-CM | POA: Diagnosis not present

## 2021-10-01 DIAGNOSIS — E7800 Pure hypercholesterolemia, unspecified: Secondary | ICD-10-CM | POA: Diagnosis not present

## 2021-10-01 DIAGNOSIS — Z0000 Encounter for general adult medical examination without abnormal findings: Secondary | ICD-10-CM | POA: Diagnosis not present

## 2022-01-06 ENCOUNTER — Other Ambulatory Visit: Payer: Self-pay | Admitting: Registered Nurse

## 2022-01-06 DIAGNOSIS — E7800 Pure hypercholesterolemia, unspecified: Secondary | ICD-10-CM

## 2022-02-08 ENCOUNTER — Ambulatory Visit
Admission: RE | Admit: 2022-02-08 | Discharge: 2022-02-08 | Disposition: A | Discharge status: Home / Self Care | Payer: No Typology Code available for payment source | Source: Ambulatory Visit | Attending: Registered Nurse | Admitting: Registered Nurse

## 2022-02-08 DIAGNOSIS — E7800 Pure hypercholesterolemia, unspecified: Secondary | ICD-10-CM

## 2022-03-03 DIAGNOSIS — E7800 Pure hypercholesterolemia, unspecified: Secondary | ICD-10-CM | POA: Diagnosis not present

## 2022-03-03 DIAGNOSIS — I2510 Atherosclerotic heart disease of native coronary artery without angina pectoris: Secondary | ICD-10-CM | POA: Diagnosis not present

## 2022-03-03 DIAGNOSIS — N529 Male erectile dysfunction, unspecified: Secondary | ICD-10-CM | POA: Diagnosis not present

## 2022-03-26 ENCOUNTER — Encounter: Payer: Self-pay | Admitting: Cardiology

## 2022-03-26 ENCOUNTER — Ambulatory Visit: Payer: BC Managed Care – PPO | Admitting: Cardiology

## 2022-03-26 VITALS — BP 132/86 | HR 88 | Temp 98.0°F | Resp 14 | Ht 76.0 in | Wt 266.2 lb

## 2022-03-26 DIAGNOSIS — I2510 Atherosclerotic heart disease of native coronary artery without angina pectoris: Secondary | ICD-10-CM

## 2022-03-26 DIAGNOSIS — E7800 Pure hypercholesterolemia, unspecified: Secondary | ICD-10-CM | POA: Diagnosis not present

## 2022-03-26 DIAGNOSIS — R931 Abnormal findings on diagnostic imaging of heart and coronary circulation: Secondary | ICD-10-CM

## 2022-03-26 MED ORDER — ATORVASTATIN CALCIUM 20 MG PO TABS: 20.0000 mg | ORAL_TABLET | Freq: Every day | ORAL | 0 refills | Status: DC

## 2022-03-26 NOTE — Progress Notes (Signed)
? ?Primary Physician/Referring:  Holland Commons, FNP ? ?Patient ID: Brad Campbell, male    DOB: 1961-12-11, 61 y.o.   MRN: [id number] ? ?Chief Complaint  ?Patient presents with  ? CT agatston 196  ? New Patient (Initial Visit)  ?  Referred by Thedora Hinders  ? ?HPI:   ? ?Brad Campbell  is a 61 y.o. 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 He is a former smoker. He is referred by PCP Dr. Sherryl Barters for abnormal Cardiac calcium score: 196, at the 83rd percentile. His recent lipid panel on 10/17/2021 showed total cholesterol 203 and LDL 128 under simvastatin 44m once daily.  ? ?He is asymptomatic. ? ?Past Medical History:  ?Diagnosis Date  ? Arthritis   ? Hyperlipidemia   ? Itching   ? ALONG INCISION LINE L HIP  ? Metallosis   ? L HIP  ? Thyroid disease   ? ?Past Surgical History:  ?Procedure Laterality Date  ? JOINT REPLACEMENT  11/2010  ? L TOTAL HIP  ? TOTAL HIP REVISION Left 07/12/2014  ? Procedure: LEFT TOTAL HIP REVISION ;  Surgeon: FGearlean Alf MD;  Location: [facility];  Service: Orthopedics;  Laterality: Left;  ? ?Family History  ?Problem Relation Age of Onset  ? Cirrhosis Mother   ? Alcohol abuse Mother   ?  ?Social History  ? ?Tobacco Use  ? Smoking status: Some Days  ?  Packs/day: 0.50  ?  Years: 30.00  ?  Pack years: 15.00  ?  Types: Cigarettes  ?  Last attempt to quit: 07/05/2000  ?  Years since quitting: 21.7  ? Smokeless tobacco: Never  ?Substance Use Topics  ? Alcohol use: Yes  ?  Alcohol/week: 2.0 standard drinks  ?  Types: 2 Cans of beer per week  ?  Comment: OCCASIONAL  ? ?Marital Status: Married  ?ROS  ?Review of Systems  ?Cardiovascular:  Negative for chest pain, dyspnea on exertion and leg swelling.  ?Objective  ?Blood pressure 132/86, pulse 88, temperature 98 ?F (36.7 ?C), temperature source Temporal, resp. rate 14, height 6' 4"  (1.93 m), weight 266 lb  3.2 oz (120.7 kg), SpO2 96 %. Body mass index is 32.4 kg/m?.  ? ?  03/26/2022  ? 11:15 AM 11/29/2016  ?  2:15 AM 11/29/2016  ? 12:36 AM  ?Vitals with BMI  ?Height 6' 4"     ?Weight 266 lbs 3 oz    ?BMI 32.42    ?Systolic [id number] ?Diastolic 86 93 95  ?Pulse 88 87 91  ?  ?Physical Exam ?Constitutional:   ?   Appearance: He is obese.  ?Neck:  ?   Vascular: No JVD.  ?Cardiovascular:  ?   Rate and Rhythm: Normal rate and regular rhythm.  ?   Pulses: Intact distal pulses.  ?   Heart sounds: Normal heart sounds. No murmur heard. ?  No gallop.  ?Pulmonary:  ?   Effort: Pulmonary effort is normal.  ?   Breath sounds: Normal breath sounds.  ?Abdominal:  ?   General: Bowel sounds are normal.  ?   Palpations: Abdomen is soft.  ?Musculoskeletal:  ?   Right lower leg: No edema.  ?   Left lower leg: No edema.  ?  ? ?Medications and allergies  ?No Known Allergies  ? ?Medication list after today's encounter  ? ?Current Outpatient Medications:  ?  atorvastatin (LIPITOR) 20 MG tablet, Take 1 tablet (20 mg total) by mouth daily., Disp: 100 tablet, Rfl: 0 ?  B Complex Vitamins (VITAMIN B COMPLEX) TABS, Take 1 tablet by mouth See admin instructions., Disp: , Rfl:  ?  cephALEXin (KEFLEX) 500 MG capsule, Take 1 capsule (500 mg total) by mouth 4 (four) times daily., Disp: 28 capsule, Rfl: 0 ?  Cholecalciferol (VITAMIN D) 2000 UNITS tablet, Take 2,000 Units by mouth every Wednesday., Disp: , Rfl:  ?  Levothyroxine Sodium 150 MCG CAPS, Take 1 tablet by mouth daily., Disp: , Rfl:  ?  Multiple Vitamins-Minerals (ZINC PO), Take 1 tablet by mouth every other day., Disp: , Rfl:  ?  sildenafil (VIAGRA) 100 MG tablet, Take 1 tablet by mouth as needed for erectile dysfunction., Disp: , Rfl:  ? ?Laboratory examination:  ?External labs:  ? ?Labs 09/28/2021: ? ?Total cholesterol 203, triglycerides 104, HDL 54, LDL 128.  Non-HDL cholesterol 149. ? ?PSA normal.  TSH normal.  Vitamin D62.2. ? ?Hb 16.0/HCT 49.4, platelets 194.  Normal indicis. ? ?BUN 19,  creatinine 1.29, EGFR 61 mL, potassium 4.6, LFTs normal. ? ?Radiology:  ? ? ? ?Cardiac Studies:  ? ?Coronary calcium score 02/08/2022: ?LM 18.2 ?LAD 171 ?LCx 0 ?RCA 6.5, ? ?Total Agatston score 196.  [place] database 83 percentile. ?Aortic root measurements are normal both ascending aorta and descending aorta.  Mild atherosclerosis of the thoracic aorta. ?No significant extracardiac abnormalities. ?EKG:  ? ?EKG 03/26/2022: Normal sinus rhythm at rate of 80 bpm, normal axis, incomplete right bundle branch block.  No evidence of ischemia.   ? ?Assessment  ? ?  ICD-10-CM   ?1. Pure hypercholesterolemia  E78.00 atorvastatin (LIPITOR) 20 MG tablet  ?  Lipid Panel With LDL/HDL Ratio  ?  LDL cholesterol, direct  ?  ?2. Coronary artery calcification seen on CAT scan  I25.10 EKG 12-Lead  ?  ?3. Elevated coronary artery calcium score Total Agatston score 196.  [place] database 83 percentile. 02/08/2022  R93.1 atorvastatin (LIPITOR) 20 MG tablet  ?  ?  ? ?Medications Discontinued During This Encounter  ?Medication Reason  ? levothyroxine (SYNTHROID, LEVOTHROID) 75 MCG tablet   ? simvastatin (ZOCOR) 10 MG tablet Change in therapy  ?  ?Meds ordered this encounter  ?Medications  ? atorvastatin (LIPITOR) 20 MG tablet  ?  Sig: Take 1 tablet (20 mg total) by mouth daily.  ?  Dispense:  100 tablet  ?  Refill:  0  ? ?Orders Placed This Encounter  ?Procedures  ? Lipid Panel With LDL/HDL Ratio  ? LDL cholesterol, direct  ? EKG 12-Lead  ? ?Recommendations:  ? ?Brad Campbell is a 61 y.o. 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 He is a former smoker. He is referred by PCP Dr. Sherryl Barters for abnormal Cardiac calcium score: 196, at the 83rd percentile. His recent lipid panel on 10/17/2021 showed total cholesterol 203 and LDL 128 under simvastatin 40m once daily.  ? ?Patient is completely asymptomatic and his  physical activity level is at least >10 METS without any chest pain or dyspnea.  Hence no cardiac stress testing or echocardiogram is indicated.  Physical examination except for mild obesity and also waist size of 40 inches other risk factors. ? ?Discontinue simvastatin, after discussions, patient is willing to start high intensity statin.  Lipitor 20 mg Rx'd.  I will repeat lipids in 2 months or  so, will send a copy to his PCP, I will see him back on a as needed basis.  Extensive discussion regarding primary prevention discussed with the patient. ? ?Regard to his lipid panel and cardiac calcium score, I will titrate up simvastatin to 20 mg once daily and order lab and exercise stress test for further evaluation. I suggested him dietary and lifestyle change.  ? ? ? ?Adrian Prows, MD, [facility] ?03/26/2022, 12:29 PM ?Office: [phone] ?

## 2022-06-30 ENCOUNTER — Other Ambulatory Visit: Payer: Self-pay | Admitting: Cardiology

## 2022-06-30 DIAGNOSIS — R931 Abnormal findings on diagnostic imaging of heart and coronary circulation: Secondary | ICD-10-CM

## 2022-06-30 DIAGNOSIS — E7800 Pure hypercholesterolemia, unspecified: Secondary | ICD-10-CM

## 2022-08-16 DIAGNOSIS — K635 Polyp of colon: Secondary | ICD-10-CM | POA: Diagnosis not present

## 2022-08-16 DIAGNOSIS — Z1211 Encounter for screening for malignant neoplasm of colon: Secondary | ICD-10-CM | POA: Diagnosis not present

## 2022-08-16 DIAGNOSIS — K5730 Diverticulosis of large intestine without perforation or abscess without bleeding: Secondary | ICD-10-CM | POA: Diagnosis not present

## 2022-09-28 DIAGNOSIS — E039 Hypothyroidism, unspecified: Secondary | ICD-10-CM | POA: Diagnosis not present

## 2022-09-28 DIAGNOSIS — E559 Vitamin D deficiency, unspecified: Secondary | ICD-10-CM | POA: Diagnosis not present

## 2022-09-28 DIAGNOSIS — E7800 Pure hypercholesterolemia, unspecified: Secondary | ICD-10-CM | POA: Diagnosis not present

## 2022-09-28 DIAGNOSIS — Z0000 Encounter for general adult medical examination without abnormal findings: Secondary | ICD-10-CM | POA: Diagnosis not present

## 2022-09-28 DIAGNOSIS — Z125 Encounter for screening for malignant neoplasm of prostate: Secondary | ICD-10-CM | POA: Diagnosis not present

## 2022-10-12 ENCOUNTER — Other Ambulatory Visit: Payer: Self-pay | Admitting: Cardiology

## 2022-10-12 DIAGNOSIS — R931 Abnormal findings on diagnostic imaging of heart and coronary circulation: Secondary | ICD-10-CM

## 2022-10-12 DIAGNOSIS — E7800 Pure hypercholesterolemia, unspecified: Secondary | ICD-10-CM

## 2022-10-20 DIAGNOSIS — E559 Vitamin D deficiency, unspecified: Secondary | ICD-10-CM | POA: Diagnosis not present

## 2022-10-20 DIAGNOSIS — I700 Atherosclerosis of aorta: Secondary | ICD-10-CM | POA: Diagnosis not present

## 2022-10-20 DIAGNOSIS — I2510 Atherosclerotic heart disease of native coronary artery without angina pectoris: Secondary | ICD-10-CM | POA: Diagnosis not present

## 2022-10-20 DIAGNOSIS — Z0000 Encounter for general adult medical examination without abnormal findings: Secondary | ICD-10-CM | POA: Diagnosis not present

## 2023-01-22 ENCOUNTER — Other Ambulatory Visit: Payer: Self-pay | Admitting: Cardiology

## 2023-01-22 DIAGNOSIS — E7800 Pure hypercholesterolemia, unspecified: Secondary | ICD-10-CM

## 2023-01-22 DIAGNOSIS — R931 Abnormal findings on diagnostic imaging of heart and coronary circulation: Secondary | ICD-10-CM

## 2023-03-18 ENCOUNTER — Telehealth: Payer: Self-pay | Admitting: *Deleted

## 2023-04-21 DIAGNOSIS — E7800 Pure hypercholesterolemia, unspecified: Secondary | ICD-10-CM | POA: Diagnosis not present

## 2023-04-26 ENCOUNTER — Other Ambulatory Visit: Payer: Self-pay | Admitting: Cardiology

## 2023-04-26 DIAGNOSIS — E7800 Pure hypercholesterolemia, unspecified: Secondary | ICD-10-CM

## 2023-04-26 DIAGNOSIS — R931 Abnormal findings on diagnostic imaging of heart and coronary circulation: Secondary | ICD-10-CM

## 2023-04-26 MED ORDER — ATORVASTATIN CALCIUM 20 MG PO TABS: 20.0000 mg | ORAL_TABLET | Freq: Every day | ORAL | 0 refills | Status: AC

## 2023-10-18 DIAGNOSIS — E7800 Pure hypercholesterolemia, unspecified: Secondary | ICD-10-CM | POA: Diagnosis not present

## 2023-10-18 DIAGNOSIS — Z125 Encounter for screening for malignant neoplasm of prostate: Secondary | ICD-10-CM | POA: Diagnosis not present

## 2023-10-18 DIAGNOSIS — E559 Vitamin D deficiency, unspecified: Secondary | ICD-10-CM | POA: Diagnosis not present

## 2023-10-18 DIAGNOSIS — E039 Hypothyroidism, unspecified: Secondary | ICD-10-CM | POA: Diagnosis not present

## 2023-10-18 DIAGNOSIS — I10 Essential (primary) hypertension: Secondary | ICD-10-CM | POA: Diagnosis not present

## 2023-10-25 DIAGNOSIS — I700 Atherosclerosis of aorta: Secondary | ICD-10-CM | POA: Diagnosis not present

## 2023-10-25 DIAGNOSIS — E559 Vitamin D deficiency, unspecified: Secondary | ICD-10-CM | POA: Diagnosis not present

## 2023-10-25 DIAGNOSIS — Z0000 Encounter for general adult medical examination without abnormal findings: Secondary | ICD-10-CM | POA: Diagnosis not present

## 2023-10-25 DIAGNOSIS — I2510 Atherosclerotic heart disease of native coronary artery without angina pectoris: Secondary | ICD-10-CM | POA: Diagnosis not present

## 2024-01-07 IMAGING — CT CT CARDIAC CORONARY ARTERY CALCIUM SCORE
3 series · 14 of 20 positions shown, 16 images · non-contrast
Comparison: None.

CLINICAL DATA: 60-year-old Caucasian male with history of
hyperlipidemia, family history of heart disease and prior smoking
history.

EXAM:
CT CARDIAC CORONARY ARTERY CALCIUM SCORE
TECHNIQUE: Non-contrast imaging through the heart was performed using
prospective ECG gating. Image post processing was performed on an
independent workstation, allowing for quantitative analysis of the
heart and coronary arteries. Note that this exam targets the heart
and the chest was not imaged in its entirety.

[Series 2: calcium scoring 2.00 qr36 bestdiast 71% hrt calciu · axial · 0.39mm/px · z∈[+1754,+1836]mm · 4 of 69 slices shown]
[im 14/69  vessel]
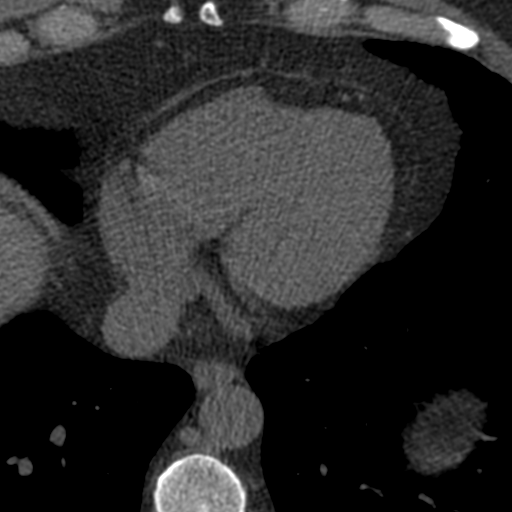
[im 28/69  vessel]
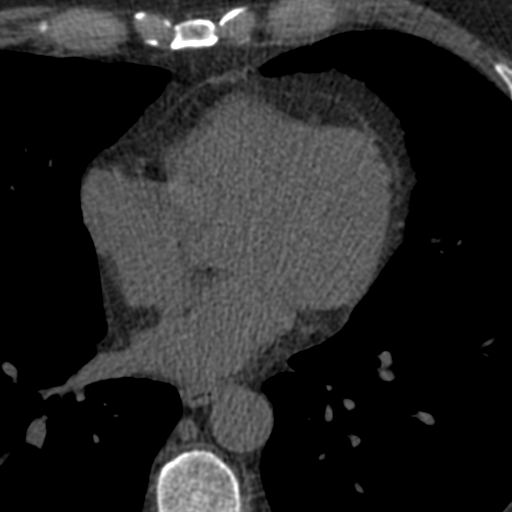
[im 41/69  vessel]
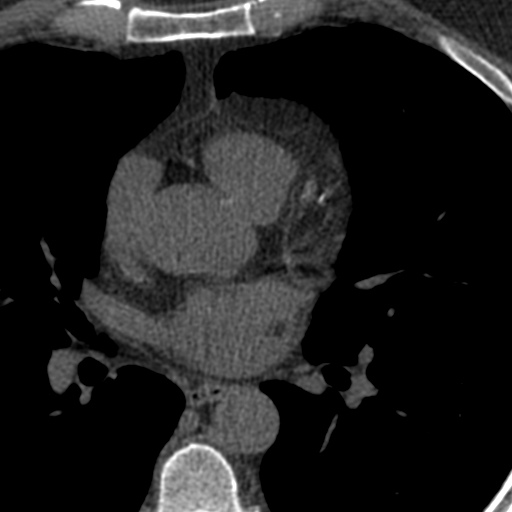
[im 55/69  vessel]
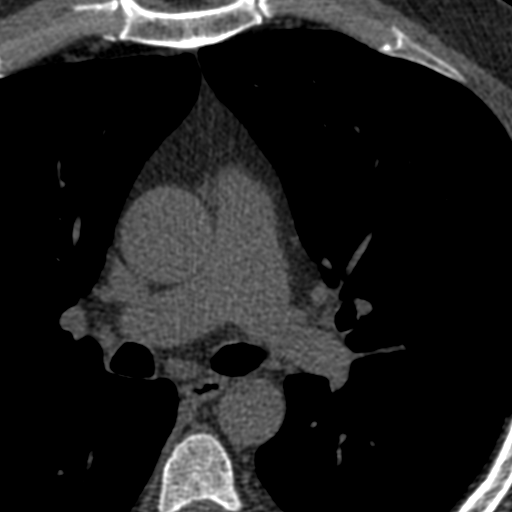

[Series 3: calcium scoring 2.00 br40 bestdiast 71% axial · axial · 0.69mm/px · z∈[+1750,+1840]mm · 5 of 69 slices shown, 7 images]
[im 12/69  vessel]
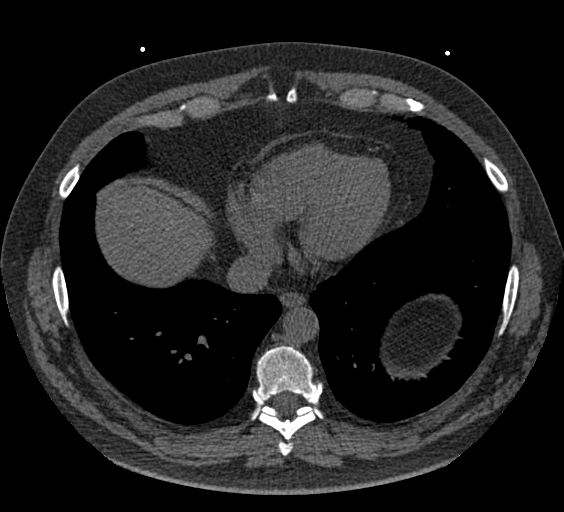
[im 12/69  lung]
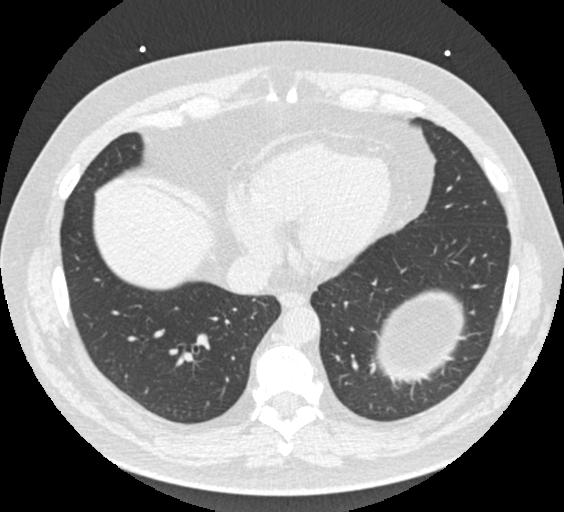
[im 23/69  vessel]
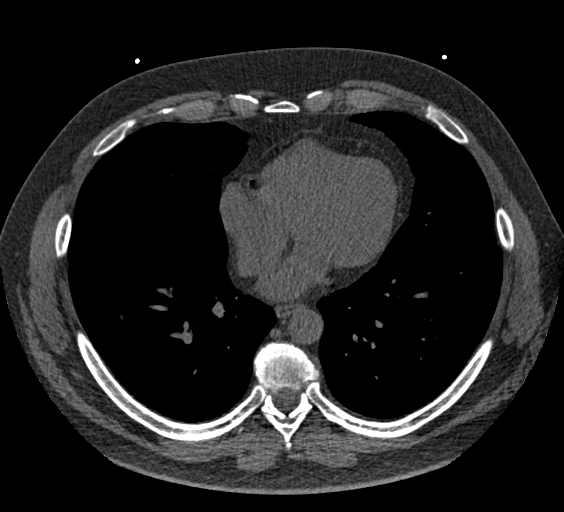
[im 35/69  vessel]
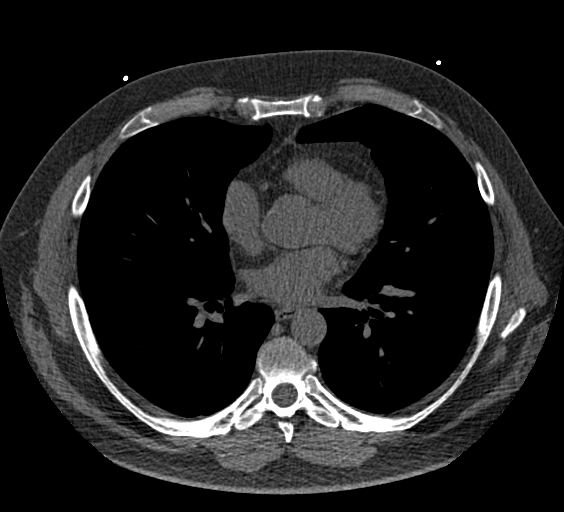
[im 46/69  vessel]
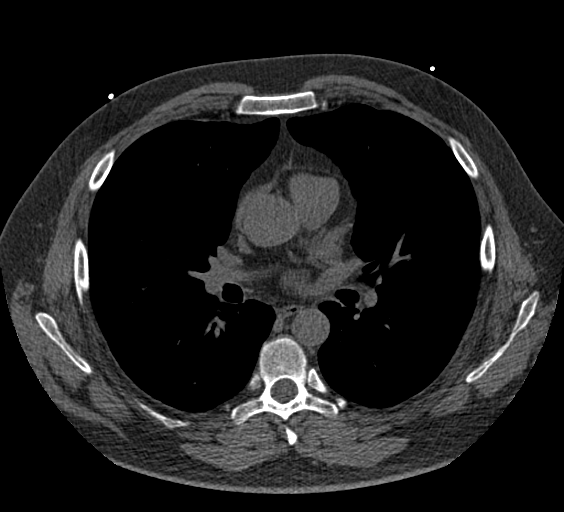
[im 57/69  vessel]
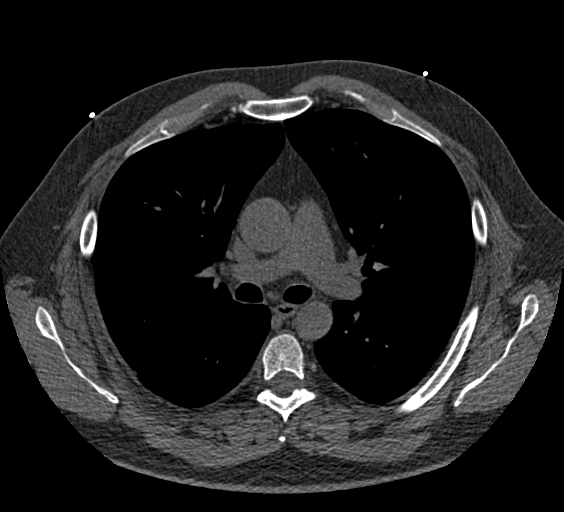
[im 57/69  lung]
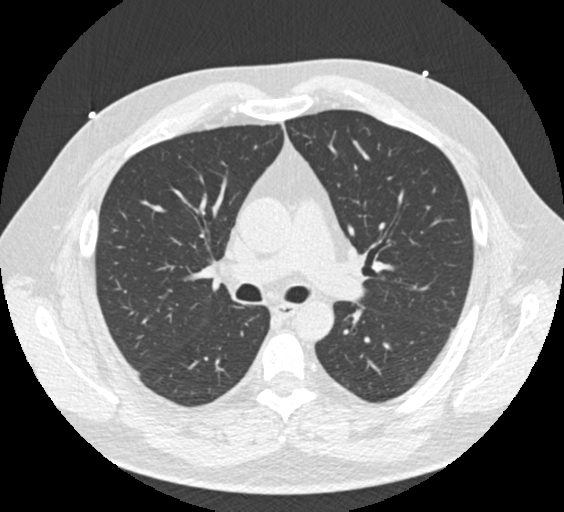

[Series 9: calcium scoring 2.00 br60 bestdiast 71% lungs · axial · 0.69mm/px · z∈[+1750,+1840]mm · 5 of 69 slices shown]
[im 12/69  vessel]
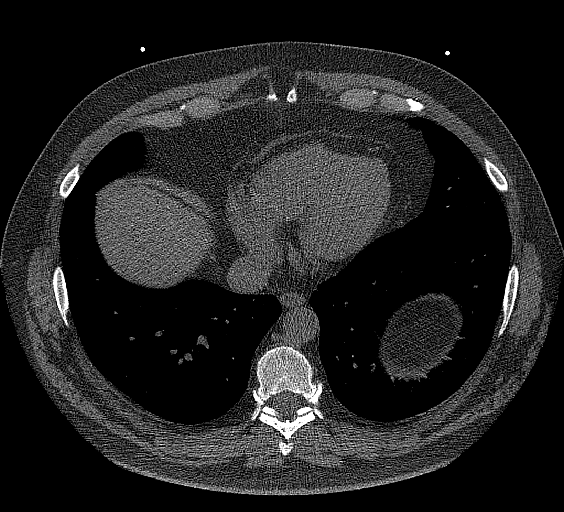
[im 23/69  vessel]
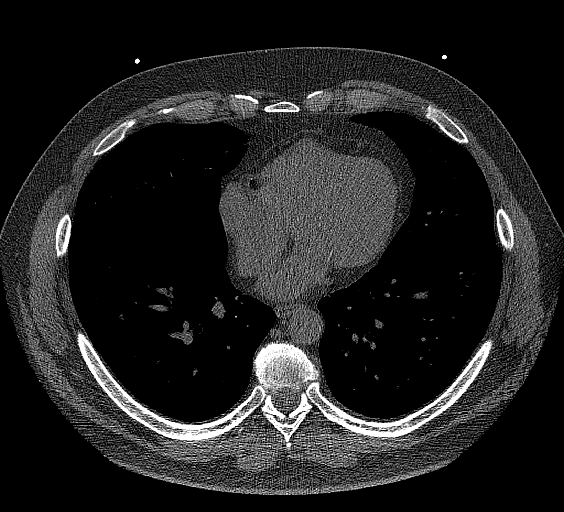
[im 35/69  vessel]
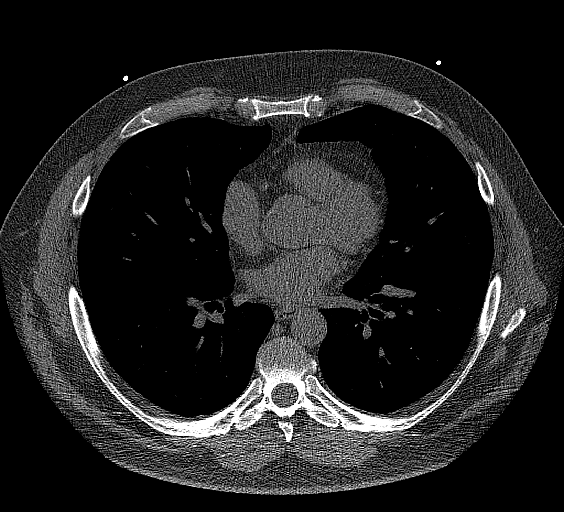
[im 46/69  vessel]
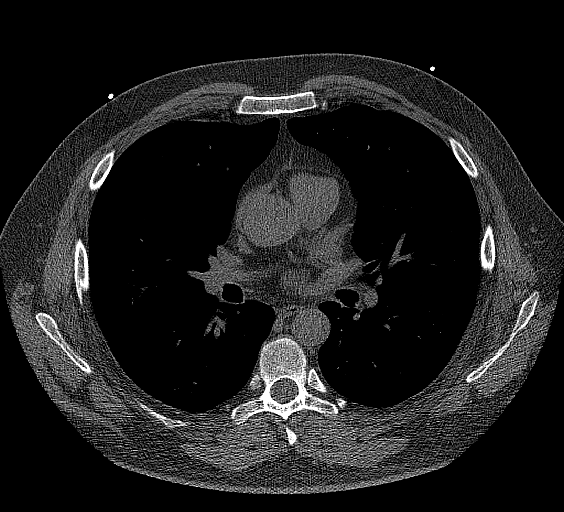
[im 57/69  vessel]
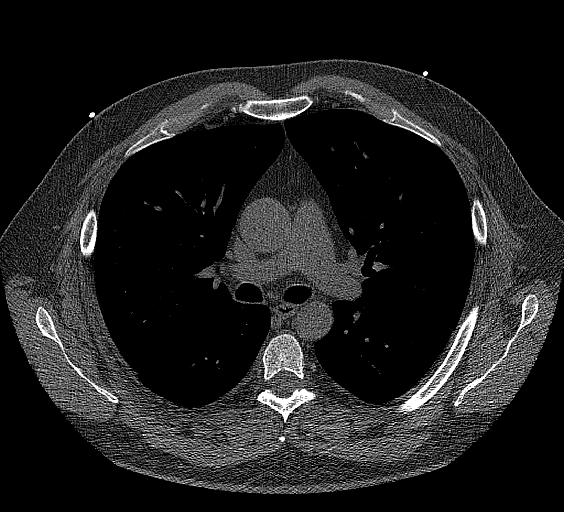

[14 of 20 positions shown; findings below may reference images not displayed]

FINDINGS: CORONARY CALCIUM SCORES:

Left Main:

LAD: 171

LCx: 0

RCA:

Total Agatston Score: 196

[HOSPITAL] percentile: 83

AORTA MEASUREMENTS:

Ascending Aorta: 35 mm

Descending Aorta: 24 mm

OTHER FINDINGS:

The heart size is within normal limits. No pericardial fluid is
identified. Mild calcified plaque in the thoracic aorta. Visualized
segments of the thoracic aorta and central pulmonary arteries are
normal in caliber. Visualized mediastinum and hilar regions
demonstrate no lymphadenopathy or masses. Visualized lungs show no
evidence of pulmonary edema, consolidation, pneumothorax, nodule or
pleural fluid. Visualized upper abdomen and bony structures are
unremarkable.
IMPRESSION: 1. Coronary calcium score 196 is at the 83rd percentile for the
patient's age, sex and race.
2. Mild atherosclerosis of the thoracic aorta.

## 2024-07-10 DIAGNOSIS — E7800 Pure hypercholesterolemia, unspecified: Secondary | ICD-10-CM | POA: Diagnosis not present

## 2024-07-10 DIAGNOSIS — E785 Hyperlipidemia, unspecified: Secondary | ICD-10-CM | POA: Diagnosis not present

## 2024-07-10 DIAGNOSIS — I10 Essential (primary) hypertension: Secondary | ICD-10-CM | POA: Diagnosis not present

## 2024-07-10 DIAGNOSIS — Z125 Encounter for screening for malignant neoplasm of prostate: Secondary | ICD-10-CM | POA: Diagnosis not present

## 2024-07-10 DIAGNOSIS — E559 Vitamin D deficiency, unspecified: Secondary | ICD-10-CM | POA: Diagnosis not present

## 2024-07-10 DIAGNOSIS — E039 Hypothyroidism, unspecified: Secondary | ICD-10-CM | POA: Diagnosis not present

## 2024-07-17 DIAGNOSIS — Z0000 Encounter for general adult medical examination without abnormal findings: Secondary | ICD-10-CM | POA: Diagnosis not present

## 2024-10-23 DIAGNOSIS — E559 Vitamin D deficiency, unspecified: Secondary | ICD-10-CM | POA: Diagnosis not present

## 2024-10-23 DIAGNOSIS — Z125 Encounter for screening for malignant neoplasm of prostate: Secondary | ICD-10-CM | POA: Diagnosis not present

## 2024-10-23 DIAGNOSIS — I2510 Atherosclerotic heart disease of native coronary artery without angina pectoris: Secondary | ICD-10-CM | POA: Diagnosis not present

## 2024-10-23 DIAGNOSIS — E538 Deficiency of other specified B group vitamins: Secondary | ICD-10-CM | POA: Diagnosis not present

## 2024-10-23 DIAGNOSIS — Z0000 Encounter for general adult medical examination without abnormal findings: Secondary | ICD-10-CM | POA: Diagnosis not present

## 2024-10-30 DIAGNOSIS — R3129 Other microscopic hematuria: Secondary | ICD-10-CM | POA: Diagnosis not present
